# Patient Record
Sex: Female | Born: 1950 | Race: White | Hispanic: No | Marital: Single | Smoking: Former smoker
Health system: Southern US, Community
[De-identification: ages and names within clinical notes are randomized; demographics above are authoritative.]

## PROBLEM LIST (undated history)

## (undated) DIAGNOSIS — T4145XA Adverse effect of unspecified anesthetic, initial encounter: Secondary | ICD-10-CM

## (undated) DIAGNOSIS — E669 Obesity, unspecified: Secondary | ICD-10-CM

## (undated) DIAGNOSIS — I10 Essential (primary) hypertension: Secondary | ICD-10-CM

## (undated) DIAGNOSIS — F419 Anxiety disorder, unspecified: Secondary | ICD-10-CM

## (undated) DIAGNOSIS — C801 Malignant (primary) neoplasm, unspecified: Secondary | ICD-10-CM

## (undated) DIAGNOSIS — T8859XA Other complications of anesthesia, initial encounter: Secondary | ICD-10-CM

## (undated) DIAGNOSIS — J45998 Other asthma: Secondary | ICD-10-CM

## (undated) DIAGNOSIS — I82409 Acute embolism and thrombosis of unspecified deep veins of unspecified lower extremity: Secondary | ICD-10-CM

## (undated) DIAGNOSIS — F329 Major depressive disorder, single episode, unspecified: Secondary | ICD-10-CM

## (undated) DIAGNOSIS — N200 Calculus of kidney: Secondary | ICD-10-CM

## (undated) DIAGNOSIS — M1990 Unspecified osteoarthritis, unspecified site: Secondary | ICD-10-CM

## (undated) DIAGNOSIS — Z87442 Personal history of urinary calculi: Secondary | ICD-10-CM

## (undated) DIAGNOSIS — Z9289 Personal history of other medical treatment: Secondary | ICD-10-CM

## (undated) DIAGNOSIS — R112 Nausea with vomiting, unspecified: Secondary | ICD-10-CM

## (undated) DIAGNOSIS — K589 Irritable bowel syndrome without diarrhea: Secondary | ICD-10-CM

## (undated) DIAGNOSIS — F32A Depression, unspecified: Secondary | ICD-10-CM

## (undated) DIAGNOSIS — D649 Anemia, unspecified: Secondary | ICD-10-CM

## (undated) DIAGNOSIS — G43909 Migraine, unspecified, not intractable, without status migrainosus: Secondary | ICD-10-CM

## (undated) DIAGNOSIS — Z9889 Other specified postprocedural states: Secondary | ICD-10-CM

## (undated) DIAGNOSIS — J189 Pneumonia, unspecified organism: Secondary | ICD-10-CM

## (undated) HISTORY — PX: COLON SURGERY: SHX602

## (undated) HISTORY — DX: Irritable bowel syndrome, unspecified: K58.9

## (undated) HISTORY — PX: TONSILLECTOMY AND ADENOIDECTOMY: SUR1326

## (undated) HISTORY — PX: CHOLECYSTECTOMY: SHX55

## (undated) HISTORY — PX: UMBILICAL HERNIA REPAIR: SHX196

## (undated) HISTORY — PX: SHOULDER ARTHROSCOPY W/ ROTATOR CUFF REPAIR: SHX2400

## (undated) HISTORY — DX: Obesity, unspecified: E66.9

## (undated) HISTORY — PX: EYE SURGERY: SHX253

---

## 1989-02-13 HISTORY — PX: CARPAL TUNNEL RELEASE: SHX101

## 1991-06-16 DIAGNOSIS — I82409 Acute embolism and thrombosis of unspecified deep veins of unspecified lower extremity: Secondary | ICD-10-CM

## 1991-06-16 HISTORY — DX: Acute embolism and thrombosis of unspecified deep veins of unspecified lower extremity: I82.409

## 1997-11-29 ENCOUNTER — Ambulatory Visit (HOSPITAL_BASED_OUTPATIENT_CLINIC_OR_DEPARTMENT_OTHER): Admission: RE | Admit: 1997-11-29 | Discharge: 1997-11-29 | Payer: Self-pay | Admitting: Orthopedic Surgery

## 1998-01-09 ENCOUNTER — Emergency Department (HOSPITAL_COMMUNITY): Admission: EM | Admit: 1998-01-09 | Discharge: 1998-01-09 | Payer: Self-pay | Admitting: Emergency Medicine

## 1998-04-16 ENCOUNTER — Encounter: Payer: Self-pay | Admitting: Emergency Medicine

## 1998-04-16 ENCOUNTER — Inpatient Hospital Stay (HOSPITAL_COMMUNITY): Admission: EM | Admit: 1998-04-16 | Discharge: 1998-04-17 | Payer: Self-pay | Admitting: Emergency Medicine

## 1999-01-22 ENCOUNTER — Ambulatory Visit (HOSPITAL_COMMUNITY): Admission: RE | Admit: 1999-01-22 | Discharge: 1999-01-22 | Payer: Self-pay | Admitting: Family Medicine

## 1999-01-22 ENCOUNTER — Encounter: Payer: Self-pay | Admitting: Family Medicine

## 1999-01-31 ENCOUNTER — Encounter: Payer: Self-pay | Admitting: General Surgery

## 1999-02-03 ENCOUNTER — Other Ambulatory Visit: Admission: RE | Admit: 1999-02-03 | Discharge: 1999-02-03 | Payer: Self-pay | Admitting: *Deleted

## 1999-02-04 ENCOUNTER — Ambulatory Visit (HOSPITAL_COMMUNITY): Admission: RE | Admit: 1999-02-04 | Discharge: 1999-02-04 | Payer: Self-pay | Admitting: General Surgery

## 1999-03-25 ENCOUNTER — Other Ambulatory Visit: Admission: RE | Admit: 1999-03-25 | Discharge: 1999-03-25 | Payer: Self-pay | Admitting: Radiology

## 2000-02-05 ENCOUNTER — Other Ambulatory Visit: Admission: RE | Admit: 2000-02-05 | Discharge: 2000-02-05 | Payer: Self-pay | Admitting: *Deleted

## 2000-03-19 ENCOUNTER — Encounter (INDEPENDENT_AMBULATORY_CARE_PROVIDER_SITE_OTHER): Payer: Self-pay | Admitting: Specialist

## 2000-03-19 ENCOUNTER — Other Ambulatory Visit: Admission: RE | Admit: 2000-03-19 | Discharge: 2000-03-19 | Payer: Self-pay | Admitting: *Deleted

## 2000-06-15 DIAGNOSIS — Z9289 Personal history of other medical treatment: Secondary | ICD-10-CM

## 2000-06-15 HISTORY — PX: TOTAL KNEE ARTHROPLASTY: SHX125

## 2000-06-15 HISTORY — DX: Personal history of other medical treatment: Z92.89

## 2001-01-31 ENCOUNTER — Other Ambulatory Visit: Admission: RE | Admit: 2001-01-31 | Discharge: 2001-01-31 | Payer: Self-pay | Admitting: *Deleted

## 2001-06-15 HISTORY — PX: TOTAL KNEE ARTHROPLASTY: SHX125

## 2001-10-26 ENCOUNTER — Encounter: Payer: Self-pay | Admitting: Orthopedic Surgery

## 2001-10-31 ENCOUNTER — Encounter: Payer: Self-pay | Admitting: Orthopedic Surgery

## 2001-10-31 ENCOUNTER — Inpatient Hospital Stay (HOSPITAL_COMMUNITY): Admission: RE | Admit: 2001-10-31 | Discharge: 2001-11-08 | Payer: Self-pay | Admitting: Orthopedic Surgery

## 2001-12-23 ENCOUNTER — Encounter: Payer: Self-pay | Admitting: General Surgery

## 2001-12-23 ENCOUNTER — Inpatient Hospital Stay (HOSPITAL_COMMUNITY): Admission: RE | Admit: 2001-12-23 | Discharge: 2001-12-30 | Payer: Self-pay | Admitting: Orthopedic Surgery

## 2001-12-26 ENCOUNTER — Encounter: Payer: Self-pay | Admitting: Orthopedic Surgery

## 2002-02-16 ENCOUNTER — Other Ambulatory Visit: Admission: RE | Admit: 2002-02-16 | Discharge: 2002-02-16 | Payer: Self-pay | Admitting: *Deleted

## 2003-03-08 ENCOUNTER — Other Ambulatory Visit: Admission: RE | Admit: 2003-03-08 | Discharge: 2003-03-08 | Payer: Self-pay | Admitting: *Deleted

## 2003-12-13 ENCOUNTER — Ambulatory Visit: Admission: RE | Admit: 2003-12-13 | Discharge: 2003-12-13 | Payer: Self-pay | Admitting: Family Medicine

## 2004-03-05 ENCOUNTER — Encounter: Admission: RE | Admit: 2004-03-05 | Discharge: 2004-06-03 | Payer: Self-pay | Admitting: Surgery

## 2004-03-10 ENCOUNTER — Other Ambulatory Visit: Admission: RE | Admit: 2004-03-10 | Discharge: 2004-03-10 | Payer: Self-pay | Admitting: *Deleted

## 2004-03-10 ENCOUNTER — Ambulatory Visit (HOSPITAL_BASED_OUTPATIENT_CLINIC_OR_DEPARTMENT_OTHER): Admission: RE | Admit: 2004-03-10 | Discharge: 2004-03-10 | Payer: Self-pay | Admitting: Surgery

## 2004-03-11 ENCOUNTER — Ambulatory Visit (HOSPITAL_COMMUNITY): Admission: RE | Admit: 2004-03-11 | Discharge: 2004-03-11 | Payer: Self-pay | Admitting: Surgery

## 2004-03-15 HISTORY — PX: ROUX-EN-Y GASTRIC BYPASS: SHX1104

## 2004-04-08 ENCOUNTER — Inpatient Hospital Stay (HOSPITAL_COMMUNITY): Admission: RE | Admit: 2004-04-08 | Discharge: 2004-04-11 | Payer: Self-pay | Admitting: Surgery

## 2004-04-19 ENCOUNTER — Inpatient Hospital Stay (HOSPITAL_COMMUNITY): Admission: EM | Admit: 2004-04-19 | Discharge: 2004-04-24 | Payer: Self-pay | Admitting: Emergency Medicine

## 2004-07-01 ENCOUNTER — Encounter: Admission: RE | Admit: 2004-07-01 | Discharge: 2004-09-29 | Payer: Self-pay | Admitting: Surgery

## 2004-07-30 ENCOUNTER — Observation Stay (HOSPITAL_COMMUNITY): Admission: EM | Admit: 2004-07-30 | Discharge: 2004-07-31 | Payer: Self-pay | Admitting: Emergency Medicine

## 2004-10-28 ENCOUNTER — Encounter: Admission: RE | Admit: 2004-10-28 | Discharge: 2005-01-26 | Payer: Self-pay | Admitting: Surgery

## 2005-02-04 ENCOUNTER — Encounter: Admission: RE | Admit: 2005-02-04 | Discharge: 2005-05-05 | Payer: Self-pay | Admitting: Surgery

## 2005-03-09 ENCOUNTER — Other Ambulatory Visit: Admission: RE | Admit: 2005-03-09 | Discharge: 2005-03-09 | Payer: Self-pay | Admitting: *Deleted

## 2005-04-03 ENCOUNTER — Encounter: Admission: RE | Admit: 2005-04-03 | Discharge: 2005-04-03 | Payer: Self-pay | Admitting: Surgery

## 2005-06-15 HISTORY — PX: SHOULDER ARTHROSCOPY W/ ROTATOR CUFF REPAIR: SHX2400

## 2005-07-07 ENCOUNTER — Encounter: Admission: RE | Admit: 2005-07-07 | Discharge: 2005-10-05 | Payer: Self-pay | Admitting: Surgery

## 2005-12-09 IMAGING — RF DG UGI W/ GASTROGRAFIN
15 of 17 series · 15 of 17 positions shown · non-contrast
Comparison: None.

CLINICAL DATA: Morbid obesity. Gastric bypass surgery yesterday.

GASTROGRAFIN UPPER GI SERIES  04/09/2004

[Series 1: run · 1 of 1 slices shown (1 of 13)]
[im 1/1]
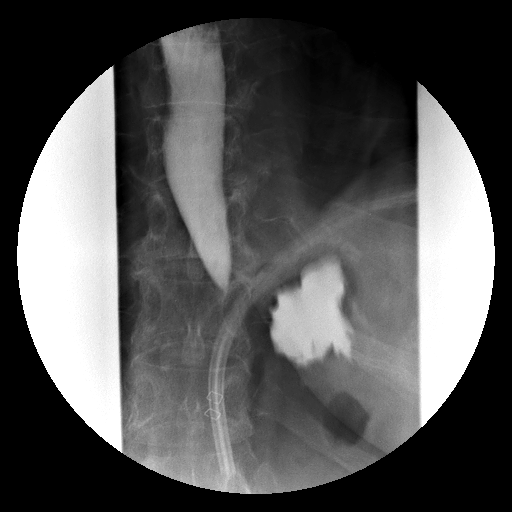

[Series 2: run · 1 of 1 slices shown (2 of 13)]
[im 1/1]
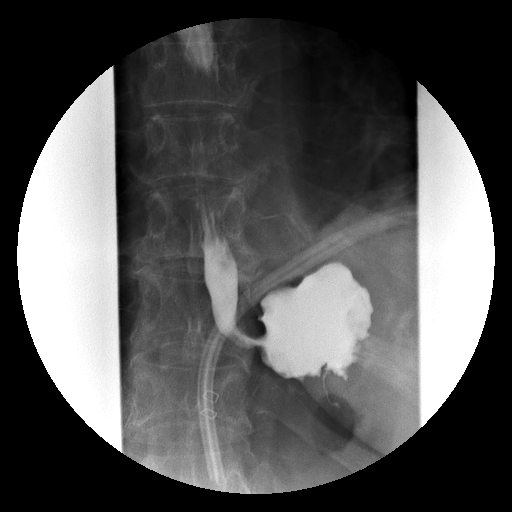

[Series 3: run · 1 of 1 slices shown (3 of 13)]
[im 1/1]
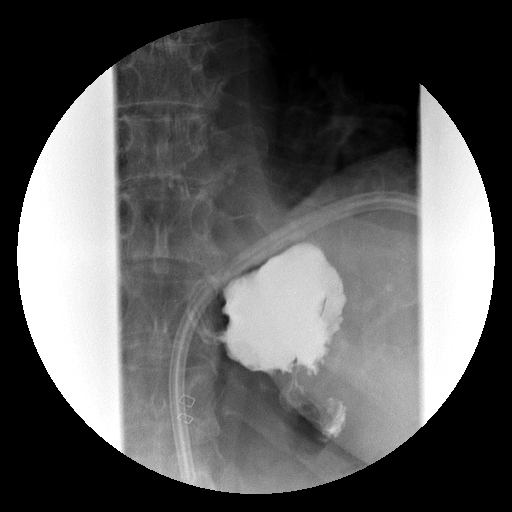

[Series 4: run · 1 of 1 slices shown (4 of 13)]
[im 1/1]
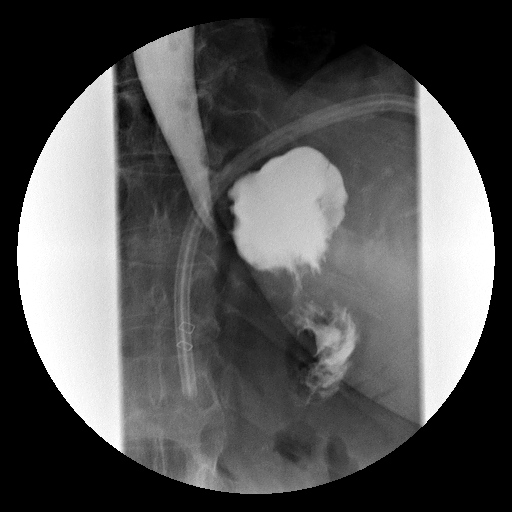

[Series 5: run · 1 of 1 slices shown (5 of 13)]
[im 1/1]
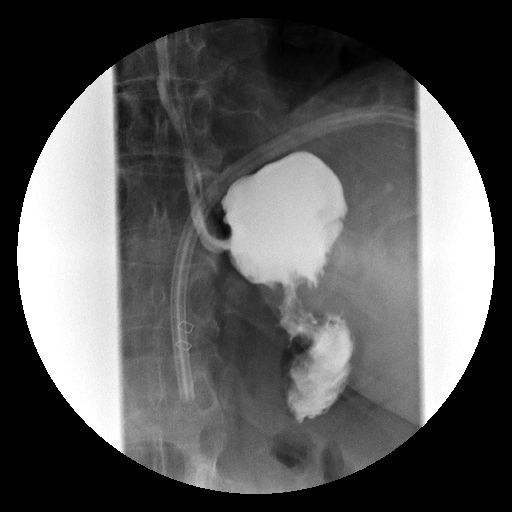

[Series 6: run · 1 of 1 slices shown (6 of 13)]
[im 1/1]
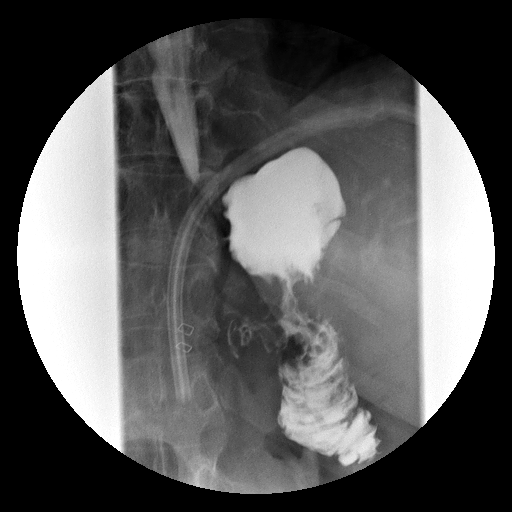

[Series 7: run · 1 of 1 slices shown (7 of 13)]
[im 1/1]
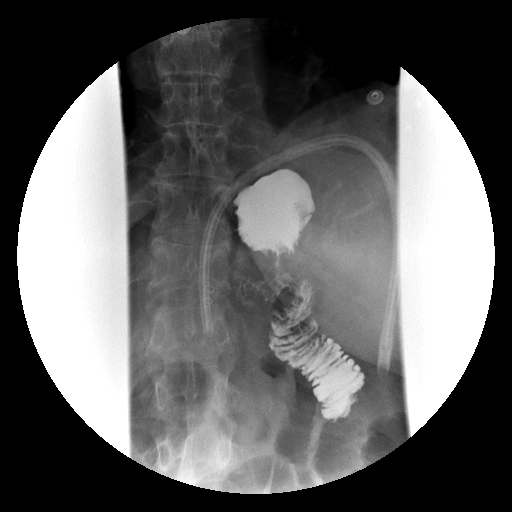

[Series 8: run · 1 of 1 slices shown (8 of 13)]
[im 1/1]
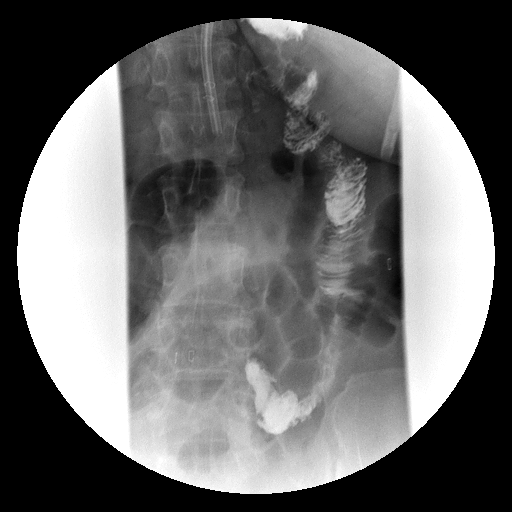

[Series 9: run · 1 of 1 slices shown (9 of 13)]
[im 1/1]
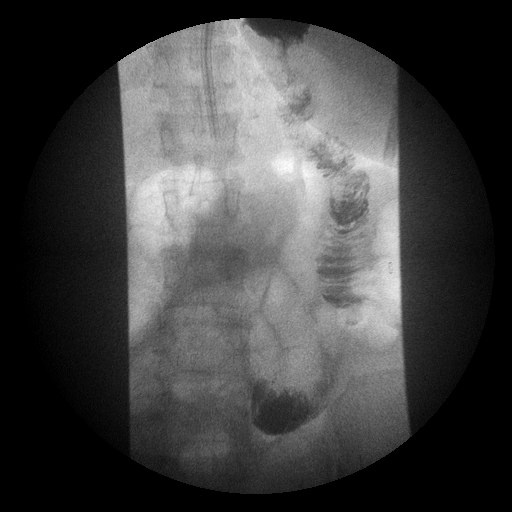

[Series 10: run · 1 of 1 slices shown (10 of 13)]
[im 1/1]
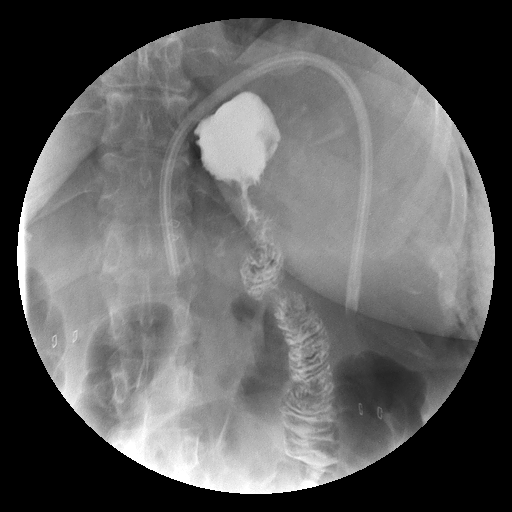

[Series 11: run · 1 of 1 slices shown (11 of 13)]
[im 1/1]
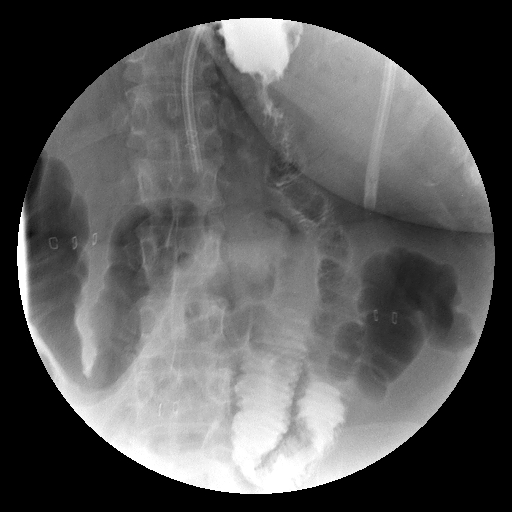

[Series 12: run · 1 of 1 slices shown (12 of 13)]
[im 1/1]
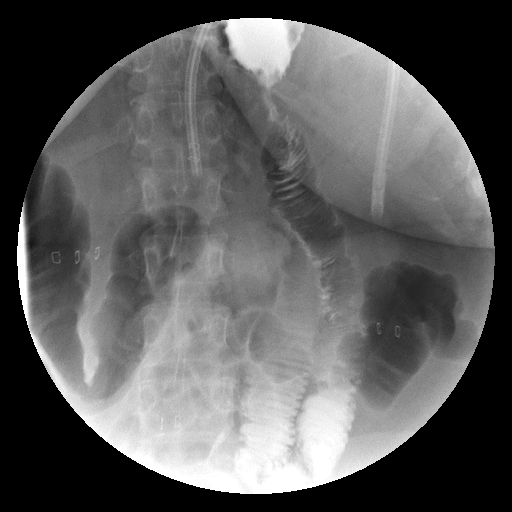

[Series 13: run · 1 of 1 slices shown (13 of 13)]
[im 1/1]
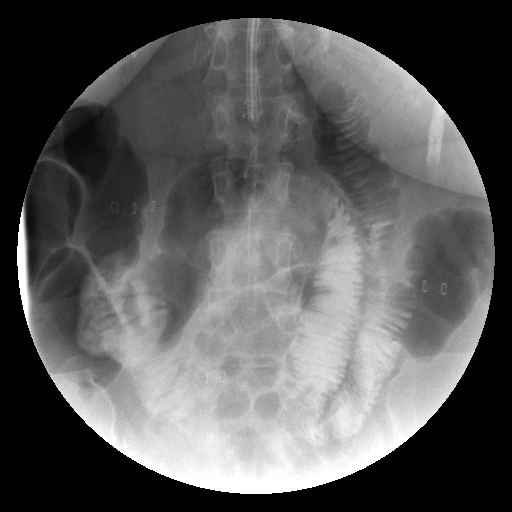

[Series 1001: view not recorded · 0.20mm/px · 1 of 1 slices shown (1 of 2)]
[im 1/1]
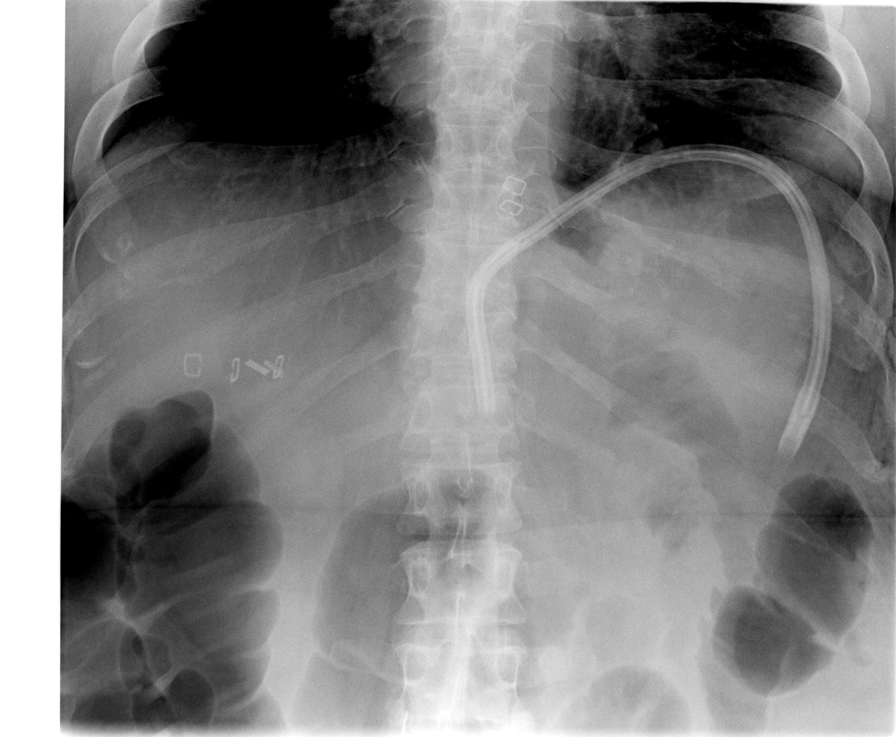

[Series 1002: view not recorded · 0.20mm/px · 1 of 1 slices shown (2 of 2)]
[im 1/1]
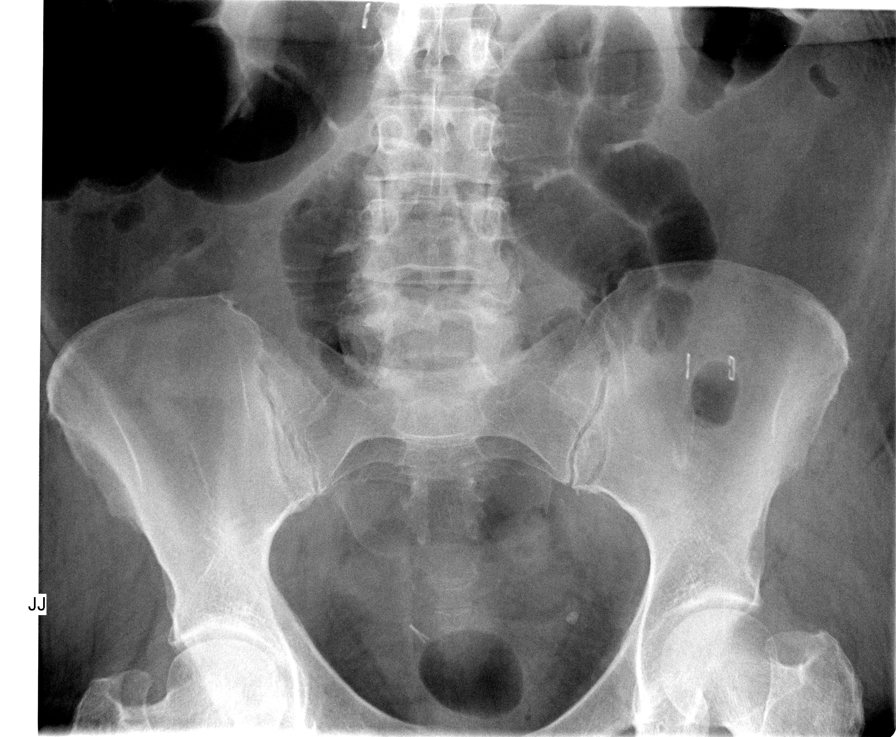

[15 of 17 positions shown; findings below may reference images not displayed]

FINDINGS: The initial scout AP supine abdominal film demonstrates mild gaseous distention of the
colon and small bowel. A phlebolith is noted in the left of the pelvis.

The patient swallowed a total of 50 cc of Gastrografin without difficulty. The gastric pouch has a
normal appearance. There is no significant esophageal dysmotility, and the esophagus emptied
promptly and normally. The gastric pouch also empties promptly. There is no evidence of leak at the
proximal  anastomosis. There is a mild ileus, with mild dilation of the efferent limb. However,
contrast ultimately reaches the level of the jejunojejunal anastomosis. No extravasation is seen
here.

IMPRESSION

1. Satisfactory appearance status post gastric bypass.

2. The gastric pouch empties promptly. There is no leak.

3. Mild ileus. 

These results were telephoned to Dr. Freesia and Dr. Kazimiera at time of the performance of the
examination on the morning of 04/09/2004.

## 2006-04-29 ENCOUNTER — Other Ambulatory Visit: Admission: RE | Admit: 2006-04-29 | Discharge: 2006-04-29 | Payer: Self-pay | Admitting: *Deleted

## 2007-03-17 ENCOUNTER — Other Ambulatory Visit: Admission: RE | Admit: 2007-03-17 | Discharge: 2007-03-17 | Payer: Self-pay | Admitting: *Deleted

## 2010-06-15 DIAGNOSIS — J189 Pneumonia, unspecified organism: Secondary | ICD-10-CM

## 2010-06-15 HISTORY — DX: Pneumonia, unspecified organism: J18.9

## 2010-07-17 ENCOUNTER — Other Ambulatory Visit (HOSPITAL_COMMUNITY)
Admission: RE | Admit: 2010-07-17 | Discharge: 2010-07-17 | Disposition: A | Discharge status: Home / Self Care | Payer: BC Managed Care – PPO | Source: Ambulatory Visit | Attending: Family Medicine | Admitting: Family Medicine

## 2010-07-17 ENCOUNTER — Other Ambulatory Visit: Payer: Self-pay | Admitting: Physician Assistant

## 2010-07-17 DIAGNOSIS — Z0000 Encounter for general adult medical examination without abnormal findings: Secondary | ICD-10-CM | POA: Insufficient documentation

## 2010-10-31 NOTE — Op Note (Signed)
NAME:  Nichole Frank, Nichole Frank             ACCOUNT NO.:  [id number]   MEDICAL RECORD NO.:  [id number]          PATIENT TYPE:  INP   LOCATION:  0002                         FACILITY:  [facility]   PHYSICIAN:  Thornton Park. Daphine Deutscher, MD  DATE OF BIRTH:  Mar 30, 1951   DATE OF PROCEDURE:  04/08/2004  DATE OF DISCHARGE:                                 OPERATIVE REPORT   PREOPERATIVE DIAGNOSES:  Morbid obesity with orthopedic comorbidities and a  basic metabolic index of 47.   PROCEDURE:  Laparoscopy Roux-e-Y gastric bypass (100 cm Roux limb with a 40  cm biopancreatic limb), closed.   SURGEON:  Thornton Park. Daphine Deutscher, M.D.   ASSISTANT:  Vikki Ports, M.D.   ENDOSCOPIST:  Sandria Bales. Ezzard Standing, M.D.   OPERATIVE TIME:  Three hours.   DESCRIPTION OF PROCEDURE:  The patient was brought to the operating room #1  and given general anesthesia.  The abdomen was prepped with Betadine and  draped sterilely.  I made a small incision in the left upper quadrant, and  using a 0-degree 10 mm scope, and a 12 mm Opti-Vu trocar, I inserted this  into the abdomen without difficulty.  This was using the Ethicon XL trocar.  The abdomen was insufflated.  The patient had adhesions from a previous mesh  repair around the umbilicus.  After placing two upper ports to the right of  the midline, both 12's, I used a harmonic scalpel to take this omentum that  was fixed into this, and divided these.  The scope port was a 10/11 to the left and below the umbilicus, and then a  second 5 mm was placed lateral on the left side for Dr. Luan Pulling to use as  a 5 mm access point.  First, we elevated a very big bulky omental drape, and found the ligament of  Treitz.  I counted 40 cm distal to this and divided the bowel with the endo  GIA.  I then marked the distal end and holding the biopancreatic limb, then  counted 100 cm downstream.  I marked it with a Penrose drain which was  sutured along the staple line.  I had reached the 100 cm  mark.  The bowel  ends were aligned on the antimesenteric borders.  A jejunojejunostomy was  created using the Ethicon stapler.  The common defect was closed with #2-0  Vicryl from either end and tied in the middle.  This was a nice tight  anastomosis, that was eventually reinforced also with Tisseel.  The  mesenteric defect was closed with a running silk.  Next, another 5 mm trocar was placed in the upper midline, through which the  [facility] retractor was inserted and the liver was elevated.  I went up at  the cardiac, at the esophagogastric junction, and came down and opened a  space to dissect to.  I counted 4 cm down the lesser curvature with the  harmonic scalpel, and began creating a retrogastric window.  This dissection  proceeded nicely, and I was able to insert the 6 cm Ethicon stapler and fire  this across.  The second  application with the 6 cm stapler brought me into  the lesser sac nicely.  I then re-identified my spot up top, and with  another 6 cm and another 4.5cm, we completely divided the stomach.  The  gastric remnant staple line was inspected and looked viable and good and  healthy, and Tisseel was applied to this.  Tisseel was applied also to the proximal gastric pouch, and this too looked  good and healthy.  The pouch was created with the aid of a UL2 which was  passed and re-passed to prevent closure of the esophagogastric junction.  Next, the Roux limb was brought up with a candy cane facing the right.  It  was a little bit tight initially, and I opened up the mesentery a little bit  more between the jejunocecostomy and the Roux limb, and I also divided the  omentum in its entirety.  This enabled the Roux limb to come up easily.  The  back wall was placed with running #2-0 Vicryl.  At that point I inspected  the jejunocecostomy and looked downstream, and it seemed that it rotated a  bit, but all limbs appeared patent, and I elected to go ahead and put a  stitch  distally on the outflow track, to keep alignment with the  biopancreatic limb.  A gastrojejunostomy was then created by opening on either side of the back  wall and inserting the 4.5 cm stapler.  This was fired once, and a nice  anastomosis was present.  The common defect was closed from either end with  #2-0 Vicryl.  Actually I ended up cutting this extra little lip of tissue  along the gastrojejunostomy and closed this defect from either end, slightly  off from the anterior staple line, but tucking this all in.  These were tied  in the midline and was hemostatic.  A second row of #2-0 Vicryl placed using the laparotome on either end  placed, using a free suture, and this imbricated the stomach and jejunum  over the suture line closure.  Dr. Sandria Bales. Newman had scrubbed in for the last closure of the  jejunocecostomy, and he went ahead and scrubbed out and put the Passy  endoscope without difficulty and documented the size of the pouch.  We saw  no evidence of leaking, bubbles, or bleeding.  He withdrew the scope and  decompressed the bowel.  I had clamped the bowel to prevent insufflation of  the entire bowel.  He removed the scope which he had passed without  difficulty.  Tisseel was applied to the gastric jejunostomy, and then a  drain was placed in the sub-hepatic space and brought out through the trocar  site on the left side.  This was sutured with a #3-0 nylon to the skin.  The  wounds were all closed with #4-0 Vicryl, after decompressing the abdomen, as  well as staples.  The patient seemed to tolerate the procedure well.  She was taken to the  recovery room in satisfactory condition.     Matt   MBM/MEDQ  D:  04/08/2004  T:  04/08/2004  Job:  [id number]   cc:   Dario Guardian, M.D.  [address]  [place]  [place] [address]  Fax: [phone]

## 2010-10-31 NOTE — Consult Note (Signed)
NAME:  Nichole Frank, Nichole Frank NO.:  [id number]   MEDICAL RECORD NO.:  [id number]          PATIENT TYPE:  OBV   LOCATION:  0105                         FACILITY:  [facility]   PHYSICIAN:  Rozanna Boer., M.D.DATE OF BIRTH:  [birth year]   DATE OF CONSULTATION:  07/30/2004  DATE OF DISCHARGE:                                   CONSULTATION   REFERRING PHYSICIAN:  Thornton Park. Daphine Deutscher, M.D.   REASON FOR CONSULTATION:  Left distal ureteral stone.   HISTORY OF PRESENT ILLNESS:  This 60 year old, single, white female was  admitted through the emergency room with acute onset of left lower quadrant  pain with nausea and vomiting several hours ago.  She has never had a  previous stone, but CT scan showed a mild to moderate left hydronephrosis  with a distal left 3 mm stone.  She has not had previous stone.  She has no  family history of stones, but did have nausea and vomiting.  She has not had  any fever.  Her last urinary tract infection was over a year ago.   MEDICATIONS:  Her medicines include lisinopril, Wellbutrin and Lasix.   ALLERGIES:  She has no known allergies.   PAST SURGICAL HISTORY:  Previous operations include:  1.  A gastric bariatric procedure on April 08, 2004.  2.  Gallbladder around 1996.  3.  Bilateral total knees in May of 2002.   PAST MEDICAL HISTORY:  No bowel problems.  No heart or lung problems.  Nonsmoker.  No asthma.  No arthritis or joint disease.   SOCIAL HISTORY:  She is single.  She works at [employer].  Does not  drink much alcohol and does not smoke.   FAMILY HISTORY:  She has a 71 year old sister.  Both parents deceased, her  father at age 32 of heart failure and mother of lymphoma at 51.  There is no  family history of stone disease, diabetes or cancer that she knows of.  She  without refills   PHYSICAL EXAMINATION:  VITAL SIGNS:  Her blood pressure is 161/86, pulse  103, temperature 99.7 degrees and respirations 18.  GENERAL  APPEARANCE:  She is a heavyset, obese, white female complaining of  left lower quadrant pain and nausea.  HEENT:  Clear.  NECK:  Supple.  LUNGS:  Clear.  ABDOMEN:  Soft, but does have some mild left CVA tenderness.  She is quite  obese.  There are some bruises over her abdomen and scars from previous  laparoscopic procedure.  PELVIC:  Deferred at this time.  EXTREMITIES:  2+ edema.  Intact sensation light.  Palpable pulses  bilaterally.  SKIN:  Warm and dry.   IMPRESSION:  1.  A 3 mm left distal ureteral stone with obstruction.  2.  Recent bariatric gastric bypass procedure in October of 2005.  3.  Nausea and vomiting.   RECOMMENDATIONS:  1.  IV fluids.  2.  Hydrate.  3.  Treat pain as needed.  4.  Heating pad.  5.  Flomax 0.4 mg might help the stone to pass as well.  6.  Will strain her urine and if it does not pass in a reasonable length of      time, then ureteroscopy could be undertaken, but I would like to see her      pass this first.      HMK/MEDQ  D:  07/30/2004  T:  07/30/2004  Job:  [id number]

## 2010-10-31 NOTE — Discharge Summary (Signed)
NAME:  Nichole Frank, Nichole Frank                       ACCOUNT NO.:  [id number]   MEDICAL RECORD NO.:  [id number]                   PATIENT TYPE:  INP   LOCATION:  5030                                 FACILITY:  [facility]   PHYSICIAN:  Oris Drone. Petrarca, P.A.-C.          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   DISCHARGE DIAGNOSES:  1. 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  2. Postoperative anemia.  3. Status post right total knee replacement.  4. Hypertension.   PROCEDURE:  Left total knee replacement.   HISTORY:  A 60 year old white single female status post right total knee  replacement.  She has had progressive degenerative changes in her left knee.  She has failed with conservative treatment on the left.  She is now  indicated for a left total knee replacement.   HOSPITAL COURSE:  A 60 year old female admitted December 23, 2001 and after  appropriate laboratory studies were obtained, as well as 1 g of Ancef IV, on-  call to the operating room, was taken to the operating room where she  underwent a left total knee replacement.  She tolerated the procedure well.  She is continued on Ancef  1 g IV q.8h. x3 doses.  A Dilaudid PCA pump was ordered for pain management.  Heparin 5000 units subcu q.12h. until her Coumadin became therapeutic.  Consultation with PT, OR, and rehab were made.  CPM 0-30 degrees for 8-10  hours per day.  She may be weightbearing as tolerated in physical therapy.  She did have some difficulty with postoperative pain management.  PCA was  discontinued and she was placed on OxyContin 40 mg p.o. b.i.d. with Percocet  5/325 one to two q.4h. p.r.n. breakthrough pain.  Transfused 2 units of  packed cells on July 13.  For her hypokalemia, K-Dur 40 mEq p.o. was given.  On July 14,  she had her Percocet discontinued, as well as the OxyContin.  Placed on Dilaudid 2-4 mg p.o. q.4h. p.r.n. pain.  Benadryl 25 mg p.o. q.8h.  p.r.n. was also ordered.  X-ray of the left knee was performed on July 14  and was within normal limits.  On July 15, she did have a Doppler of her  left leg to rule out a DVT.  OxyContin was increased from 30 to 40 mg q.12h.  Percocet for breakthrough pain.  On July 16, she was allowed to shower.  The  remainder of her hospital course was uneventful and she was discharged on  July 18 to return back to the office in followup.  Doppler July 15 was read  as no evidence of DVT, SVT.  Unable to evaluate for Baker's cyst due to body  habitus.   RADIOGRAPHIC STUDIES:  Left knee of  July 11 revealed anatomic alignment  status post left total knee replacement.  On July 14, x-ray of the left knee  showed satisfactory left total knee replacement.   LABORATORY STUDIES:  Preoperative hemoglobin was 12.5, hematocrit 37.7%,  white count 6900, platelets 294,000.  Discharge hemoglobin 9.4, hematocrit  28.4%, white count 6200, platelets 244,000.  Chemistries preoperatively:  Sodium 136, potassium 4.3, chloride 101, CO2 30, glucose 91, BUN 16,  creatinine 0.9, calcium 9.0, total protein 6.3, albumin 3.2, AST 17, ALT 16,  ALP 67, total bilirubin 0.4.  Discharge sodium 141, potassium 3.9, chloride  103, CO2 32, glucose 103, BUN 5, creatinine 0.6, calcium 8.4.  Urinalysis  was benign for voided urine.  Blood type O+, antibody screen negative.  Two  units of packed cells were given during her hospital course.   DISCHARGE INSTRUCTIONS:  She was given a prescription for OxyContin 20 mg  one tablet q.12h., Percocet 5/325 one to two tablets q.3-4h. p.r.n.  breakthrough pain, iron sulfate 324 mg daily, Coumadin as directed by  [facility], Robaxin 500 mg one tablet q.8h. p.r.n. spasm.   ACTIVITIES:  As taught in physical therapy.   DIET:  No restrictions on her  diet.   WOUND CARE:  She will keep her wounds clean and dry.  Cover with a bandage  as taught.  Call if she has any problems in the interim.   FOLLOW UP:  Follow back up with [place] in 7-10 days for a recheck evaluation.                                                Oris Drone Petrarca, P.A.-C.    BDP/MEDQ  D:  01/07/2002  T:  01/15/2002  Job:  [id number]

## 2010-10-31 NOTE — H&P (Signed)
NAME:  Nichole Frank, Nichole Frank             ACCOUNT NO.:  [id number]   MEDICAL RECORD NO.:  [id number]          PATIENT TYPE:  INP   LOCATION:  0103                         FACILITY:  [facility]   PHYSICIAN:  Currie Paris, M.D.DATE OF BIRTH:  02-22-1951   DATE OF ADMISSION:  04/19/2004  DATE OF DISCHARGE:                                HISTORY & PHYSICAL   CHIEF COMPLAINT:  Abdominal pain.   HISTORY OF PRESENT ILLNESS:  Nichole Frank is a 60 year old lady who is  approximately 9 days status post Roux-Y bariatric gastric bypass.  Yesterday  afternoon about 5 o'clock she developed some left upper quadrant pain, which  has been persistent.  It waxes and wanes a little bit, but is fairly steady,  mainly in the left upper quadrant.  She has no pain in the right upper  quadrant or lower abdomen.  She has had no nausea, vomiting, fevers, or  chills.  Because of the persistent pain, she apparently spoke with Dr.  Magnus Ivan early this morning, and was told if the pain medicine was not  working to go ahead and come to the emergency room, which she did this  afternoon.   At this point, she still has not had any nausea or vomiting.  She continues  to cough and have pain on the left side of the abdomen.  It is not made  better or worse by anything she does in particularly, although changing  position does seem to hurt a little bit.  She has not had any urinary  symptoms.  No burning or stinging.  She has had no other illnesses.   PAST MEDICAL HISTORY:  There have been no changes since her recent  admission.  She does have a history of hypertension, and has not been on  medicines.  She has taken Wellbutrin in the past but has not started that  since went home from her surgery.  She has had bilateral knee replacements,  apparently necessitated by wear and tear on her knees from her obesity.   PHYSICAL EXAMINATION:  GENERAL:  The patient is alert, awake, comfortable,  and in no distress sitting in a  chair.  HEENT:  Head is normocephalic.  Eyes are nonicteric pharynx is normal.  Mucous membranes are perhaps slightly dry.  NECK:  Supple.  No masses or thyromegaly.  LUNGS:  Normal respirations, and clear to auscultation.  HEART:  Regular rhythm.  No murmurs, rubs, or gallops are heard.  ABDOMEN:  Obese.  Well-healed incisions from her bariatric surgery.  She is  tender across the left upper abdomen with some guarding and perhaps some  rebound.  The rest of the abdomen seems fairly soft and not particularly  tender.  Bowel sounds are present.  EXTREMITIES:  No cyanosis or edema.   LABORATORY DATA:  Of particular note, her white count is about 18,000 with a  hemoglobin of 13 (hemoglobin was 12, white count 9900 on April 10, 2004).  Serum electrolytes looked fairly normal.  KUB and upright chest x-ray showed  no evidence of free air.  There were some dilated loops of small  bowel in  the abdomen.  Question early small bowel obstruction.   IMPRESSION:  Early postoperative abdominal pain, status post recent  bariatric bypass.   PLAN:  I am going to get her admitted on some IV fluids, as I think she is a  little volume depleted.  In addition, a CT scan has been ordered.     Chri   CJS/MEDQ  D:  04/19/2004  T:  04/19/2004  Job:  [id number]

## 2010-10-31 NOTE — Procedures (Signed)
[place]  Patient:    [name], [name] Visit Number: [id number] MRN: [id number]          Service Type: SUR Location: 5000 5028 01 Attending Physician:  Colbert Ewing Dictated by:   Guadalupe Maple, M.D. Proc. Date: 10/31/01 Admit Date:  10/31/2001                             Procedure Report  PROCEDURE:  Lumbar epidural catheter placement for postoperative pain control.  ANESTHESIOLOGIST:  Guadalupe Maple, M.D.  INDICATIONS:  Nichole Frank is a 60 year old white female with a history of degenerative disk disease involving the right knee.  She underwent a right total knee replacement by Dr. Loreta Ave under general endotracheal anesthesia.  Prior to the procedure, the risks and benefits of epidural pain control were discussed with the patient and she understood these risks and agreed to proceed with this form of pain control following surgery. Dr. Eulah Pont requested that I insert the epidural catheter.  DESCRIPTION OF PROCEDURE:  Upon completion of the procedure, while the patient was still under general endotracheal anesthesia, she was turned to the right lateral decubitus position.  The low back was prepped and draped sterilely. The L3-4 interspace was entered in the midline using an 18-gauge Tuohy needle. One pass of the needle was required to enter the epidural space in the midline using a loss-of-resistance technique with air.  Aspiration of the needle was negative for blood or CSF.  Ten cc of preservative-free saline to which were added 100 mcg of fentanyl and 50 mg of 1% lidocaine were injected through the needle without difficulty.  The catheter was then threaded 4 cm into the epidural space and the needle was removed without difficulty.  The catheter was then taped securely in place.  The patient was subsequently turned to the supine position, extubated and brought to the recovery room in stable condition.  She will  then be begun on an epidural fentanyl and Marcaine infusion and followed on a daily basis by the anesthesia service.  She tolerated the procedure well without apparent complications.Dictated by: Guadalupe Maple, M.D. Attending Physician:  Colbert Ewing DD:  10/31/01 TD:  11/01/01 Job: [id number]

## 2010-10-31 NOTE — Discharge Summary (Signed)
[name], Nichole Frank             ACCOUNT NO.:  [id number]   MEDICAL RECORD NO.:  [id number]          PATIENT TYPE:  INP   LOCATION:  0464                         FACILITY:  [facility]   PHYSICIAN:  Vikki Ports, MDDATE OF BIRTH:  1951/01/27   DATE OF ADMISSION:  04/08/2004  DATE OF DISCHARGE:  04/11/2004                                 DISCHARGE SUMMARY   ADMISSION DIAGNOSES:  Morbid obesity with orthopedic comorbidities.   DISCHARGE DIAGNOSES:  Morbid obesity with orthopedic comorbidities.   PROCEDURE:  Laparoscopic roux-en-Y gastric bypass.   HOSPITAL COURSE:  The patient was admitted after home bowel prep and  underwent laparoscopic roux-en-Y gastric bypass. She was watched in the step-  down unit secondary to her obstructed sleep apnea. The Foley was removed.  Upper GI showed no leak and a patent anastomosis. She had a negative  ultrasound for DVT, she was started on a clear liquid diet. No beds were  available and the patient remained in the ICU until postoperative day where  she was transferred to [facility]. She was then started on a high protein  liquid diet which she tolerated slowly but was doing well and by  postoperative day #3 was ready for discharge home.   DISPOSITION:  Discharged to home.   CONDITION ON DISCHARGE:  Good and improved.   FOLLOW UP:  With Dr. Daphine Deutscher in one week.     [name]   KRH/MEDQ  D:  04/11/2004  T:  04/12/2004  Job:  [id number]

## 2010-10-31 NOTE — Procedures (Signed)
NAME:  Nichole Frank, Nichole Frank             ACCOUNT NO.:  [id number]   MEDICAL RECORD NO.:  [id number]          PATIENT TYPE:  OUT   LOCATION:  SLEEP CENTER                 FACILITY:  [facility]   PHYSICIAN:  Clinton D. Maple Hudson, M.D. DATE OF BIRTH:  26-Feb-1951   DATE OF STUDY:  DATE OF DISCHARGE:  03/10/2004                              NOCTURNAL POLYSOMNOGRAM   REFERRING PHYSICIAN:  Dr. Luretha Murphy   INDICATION FOR STUDY:  Hypersomnia with sleep apnea.   EPWORTH SLEEPINESS SCORE:  2/24   BMI:  49.   WEIGHT:  318 pounds.   MEDICATION LIST:  Lasix, Klor-Con, Lisinopril, phentermine, Wellbutrin.   SLEEP ARCHITECTURE:  Total sleep time 325 minutes with sleep efficiency 81%.  Stage I was 17%, Stage II 77%, Stages III and IV were absent, REM was 6% of  total sleep time.  Sleep latency 12 minutes, REM latency 291 minutes, awake  after sleep onset 64 minutes, arousal index 84, which is markedly increased.  It appears related to leg jerks, as well as respiratory events.   RESPIRATORY DATA:  Split-study protocol.  RDI 42.7/hr, indicating moderately  severe obstructive sleep apnea/hypopnea syndrome before CPAP control. This  reflected to obstructive apneas at 120 hypopneas.  The events were not  positional.  REM RDI 3/hr.  CPAP was titrated to 9 CWP, RDI 0/hr.  A large  Resmed full face mask was used with a heated humidifier and tolerated well.   OXYGEN DATA:  Moderate to loud snoring with mild oxygen desaturation to a  nadir of 87%  before CPAP.  After CPAP control, mean saturation held 94-956%  on room air.   CARDIAC DATA:  Normal sinus rhythm with rare PVC.   MOVEMENT/PARASOMNIA:  269 limb jerks were recorded of which 114 were  associated with arousal or awakening for a periodic limb movement with  arousal index of 21/hr which is significantly abnormal.   IMPRESSION/RECOMMENDATION:  Moderately severe obstructive sleep  apnea/hypopnea syndrome, respiratory disturbance index 42.7/hr with  mild  oxygen desaturation to 87%.  Successful CPAP titration to 9 CWP, RDI 0/hr,  using a large Resmed full face mask with heated humidifier.  An additional  diagnosis of periodic limb movement  syndrome with arousal, 21 times per hour, is noteworthy, and may respond to  a trial of clonazepam of Requip if appropriate.      CDY/MEDQ  D:  03/16/2004 11:55:46  T:  03/17/2004 66:44:03  Job:  [id number]

## 2010-10-31 NOTE — Op Note (Signed)
[name]  Patient:    Nichole Frank, Nichole Frank Visit Number: [id number] MRN: [id number]          Service Type: SUR Location: 5000 5028 01 Attending Physician:  Colbert Ewing Dictated by:   Loreta Ave, M.D. Proc. Date: 10/31/01 Admit Date:  10/31/2001                             Operative Report  PREOPERATIVE DIAGNOSIS:  End-stage degenerative arthritis of right knee with varus alignment.  POSTOPERATIVE DIAGNOSIS:  End-stage degenerative arthritis of right knee with varus alignment.  OPERATIVE PROCEDURE:  Right total knee replacement utilizing Osteonics prosthesis, Press-Fit #9 posterior stabilizing femoral component, cemented #9 tibial component with 12 mm polyethylene insert, cemented recessed nonmetal back 28 mm patellar component, appropriate soft tissue balancing including lateral release.  SURGEON:  Loreta Ave, M.D.  ASSISTANT:  Arlys John D. Petrarca, P.A.-C.  ANESTHESIA:  General.  ESTIMATED BLOOD LOSS:  Minimal.  TOURNIQUET TIME:  One hour and 20 minutes.  SPECIMENS:  Excised bone and soft tissue.  CULTURES:  None.  COMPLICATIONS:  None.  DRESSING:  Self-compressive.  DRAINS:  Hemovac x2.  DESCRIPTION OF PROCEDURE:  The patient was brought to the operating room and placed on the operating table in the supine position.  After adequate anesthesia had been obtained, the right knee examined.  Mild flexion contracture with further flexion to 90 degrees.  Alignment varus and stable ligaments.  Correctable to neutral.  Lateral patellofemoral tracking and tethering.  Tourniquet applied, prepped and draped in the usual sterile fashion.  Exsanguinated with elevation and Esmarch.  Tourniquet inflated to 375 mmHg.  A straight incision above the patella down to the tibial tubercle. Skin and subcutaneous tissue divided and hemostasis obtained with cautery. Medial parapatellar arthrotomy.  Knee exposed.  Grade 4 changes  throughout. Medial capsular release.  Loose bodies, remnants of menisci, spurring, and hypertrophic synovitis of ACL all resected.  Distal femur exposed. Intermedullary guide placed.  Distal cut 10 mm at 5 degrees of valgus.  Size to #9 component.  Definitive cuts made.  Trial put in place.  Initially went with a cruciate-retaining prosthesis, but because of PCL and soft tissue contracture posteriorly, PCL and posterior capsule were released, and definitive cuts were made for the posterior stabilizing component.  That trial fit as well.  The proximal tibia exposed.  Tibial spine was removed with the saw.  Intermedullary guide placed.  Proximal cut removing 6 mm with a 5 degree posterior slope cut.  Sized to a #9 component.  All recess examined.  All contracture released.  All remnants of menisci and loose bodies removed. Patella was sized, reamed, and drilled for a 28 mm component.  Trial was put in place throughout.  A #9 on the tibia, #9 on the femur.  With the 12 mm insert and the 28 mm patella, had full extension and full flexion, nicely balanced knee without lift off.  The patellofemoral joint however had lateral tracking which was markedly improved after lateral release which was performed with cautery from inside out.  Tibia was marked for appropriate rotation with the trials and then hand reamed for the tibial component.  All trials removed. Copious irrigation with the pulse irrigating device.  Cement prepared and placed on the tibial component which was hammered into place.  Polyethylene attached.  Femoral component seated and the knee reduced.  Patellar component firmly cemented into  place in the patella and excessive cement removed.  Once all the cement had hardened, the knee was reexamined.  Excellent motion, good stability, good alignment, and good tracking.  The wound was irrigated. A Hemovac was placed through separate stab wounds.  Arthrotomy closed with #1 Vicryl.  The  skin and subcutaneous tissue with Vicryl and staples.  The margins of the wound and the knee injected with Marcaine and Hemovac clamped. A sterile compressive dressing applied.  Tourniquet deflated and removed. Knee immobilizer applied.  Anesthesia reversed.  Brought to the recovery room.  Tolerated surgery well with no complications. Dictated by:   Loreta Ave, M.D. Attending Physician:  Colbert Ewing DD:  10/31/01 TD:  11/02/01 Job: [id number]

## 2010-10-31 NOTE — Op Note (Signed)
[place]  Patient:    Nichole Frank, Nichole Frank Visit Number: [id number] MRN: [id number]          Service Type: SUR Location: 5000 5030 01 Attending Physician:  Colbert Ewing Dictated by:   Loreta Ave, M.D. Proc. Date: 12/23/01 Admit Date:  12/23/2001                             Operative Report  PREOPERATIVE DIAGNOSIS:  End-stage degenerative arthritis, varus alignment, and flexion contracture, left knee.  POSTOPERATIVE DIAGNOSIS:  End-stage degenerative arthritis, varus alignment, and flexion contracture, left knee.  PROCEDURE:  Left total knee replacement.  Osteonics prosthesis.  Press-fit, posterior-stabilizing #9 femoral component.   Cemented #9 tibial component.  A 10 mm polyethylene insert.  Cemented, recessed, nonmetal-backed 28 mm patellar component.  Appropriate soft tissue balancing, including lateral release.  SURGEON:  Loreta Ave, M.D.  ASSISTANT:  Arlys John D. Petrarca, P.A.-C.  ANESTHESIA:  General.  ESTIMATED BLOOD LOSS:  Minimal.  SPECIMENS:  Excised bone and soft tissue.  CULTURES:  None.  COMPLICATIONS:  None.  DRESSING:  Soft compressive.  DRAINS:  Hemovac x2.  TOURNIQUET TIME:  70 minutes.  DESCRIPTION OF PROCEDURE:  Patient brought to the operating room and placed on the operating table in supine position.  After adequate anesthesia had been obtained, left knee examined.  A 5 degree flexion contracture.  Alignment in 5 degrees of varus, barely correctable to neutral.  Further flexion to almost 90 degrees.  Tourniquet applied, prepped and draped in the usual sterile fashion. Exsanguinated with elevation and Esmarch and tourniquet inflated to 375 mmHg because of the size of the patients leg.  Anterior incision above the patella down to the tibial tubercle.  Numerous thick layers of adipose tissue before exposing the knee.  Medial parapatellar arthrotomy.  Knee exposed.  Grade 4 changes  throughout.  Periarticular spurs, remnants of menisci and cruciate ligaments, and adhesions and excessive fat all debrided.  Distal femur exposed. The intramedullary guide placed.  Distal cut set at 5 degrees of valgus, removing 10 mm.  Sized to a #9 component.  Medial capsular release had already been done to help expose the knee and rebalance it.  Jigs put in place on the femur.  This fit the #9 component.  Definitive cuts made.  Trial put in place and found to fit well.  Trial removed.  Tibia exposed.  Tibial spine removed with a saw.  Intramedullary guide placed.  Proximal cut, removing 4-5 mm down to good bone off the deficient medial side.  A 5 degree posterior slope cut.  The patella was sized, reamed, and drilled for a 28 mm component. All trials put in place.  With the #9 on the femur, #9 on the tibia, and a 10 mm insert as well as the 28 patella, I had full extension, full flexion, a nicely-balanced knee without lift-off in flexion and with good stability.  A lateral release necessary to balance the patellofemoral joint.  This was performed with cautery, and afterwards she had good tracking.  The tibia was marked for appropriate rotation and then hand-reamed for the tibial component. All trials removed.  Copious irrigation with the pulse irrigating device. Cement prepared, placed on the tibial component, which was hammered into place.  Polyethylene attached.  Femoral component seated.  Knee reduced. Patellar component cemented in the drilled, recessed area.  Once the cement had hardened, the knee  was re-examined.  Full extension, full flexion, good stability, good alignment, good patellofemoral tracking.  Hemovac was placed, brought out through separate stab wounds.  Wound irrigated.  Arthrotomy closed with #1 Vicryl, skin and subcutaneous tissue with Vicryl and staples.  Margins of the wound injected with Marcaine, as was the knee, and the Hemovacs were clamped.  A sterile  compressive dressing applied.  Tourniquet deflated and removed.  Knee immobilizer applied.  Anesthesia reversed.  Brought to the recovery room.  He tolerated the surgery well with no complications. Dictated by:   Loreta Ave, M.D. Attending Physician:  Colbert Ewing DD:  12/23/01 TD:  12/26/01 Job: [id number]

## 2010-10-31 NOTE — Op Note (Signed)
NAME:  Nichole Frank, Nichole Frank             ACCOUNT NO.:  [id number]   MEDICAL RECORD NO.:  [id number]          PATIENT TYPE:  INP   LOCATION:  0155                         FACILITY:  [facility]   PHYSICIAN:  Sandria Bales. Ezzard Standing, M.D.  DATE OF BIRTH:  May 14, 1951   DATE OF PROCEDURE:  04/08/2004  DATE OF DISCHARGE:                                 OPERATIVE REPORT   PREOPERATIVE DIAGNOSIS:  Morbid obesity, status post laparoscopic Roux-en-Y  gastric bypass.   POSTOPERATIVE DIAGNOSIS:  Morbid obesity, with a body mass index of  approximately 47, status post Roux-en-Y gastric bypass.   PROCEDURE:  Esophagogastroscopy.   SURGEON:  Sandria Bales. Ezzard Standing, M.D.   ANESTHESIA:  General endotracheal.   INDICATIONS FOR PROCEDURE:  Ms. Morace is a 60 year old white female who has  completed a laparoscopic Roux-en-Y gastrojejunostomy by Dr. Luretha Murphy.  I am going to do an upper endoscopy to document the patency of the  anastomosis, make sure there is no leak or bleeding from his stomach pouch.    Surgical Procedure:   With the patient in the supine position, a flexible Olympus endoscope was  passed without difficulty down her esophagus into her stomach.  The  gastrojejunal anastomosis was noted at 45-46 cm.  Her esophagogastric  junction was right at 40 cm for about a 5-6 cm pouch.  The anastomosis was  widely patent.  There was no air leak with Dr. Daphine Deutscher flooding the upper  abdomen with saline and there was no evidence of bleeding from the stomach  pouch.  The esophagus itself was otherwise unremarkable.   The patient tolerated the procedure well.  Dr. Daphine Deutscher will dictated the Roux-  en-Y gastric bypass.     Davi   DHN/MEDQ  D:  04/08/2004  T:  04/08/2004  Job:  [id number]   cc:   [place] Daphine Deutscher, M.D.  [address], [address]  [place]  [place] [address]  Fax: [phone]

## 2010-10-31 NOTE — Discharge Summary (Signed)
NAME:  ALTIE, SAVARD             ACCOUNT NO.:  [id number]   MEDICAL RECORD NO.:  [id number]          PATIENT TYPE:  INP   LOCATION:  0448                         FACILITY:  [facility]   PHYSICIAN:  Thorn[place] Park. Daphine Deutscher, MD  DATE OF BIRTH:  04/15/1951   DATE OF ADMISSION:  04/19/2004  DATE OF DISCHARGE:  04/24/2004                                 DISCHARGE SUMMARY   ADMITTING DIAGNOSIS:  Recurrent abdominal pain 10 days out from Roux-Y  gastric bypass done laparoscopically.   HOSPITAL COURSE:  Durinda Buzzelli was admitted by Dr. Jamey Ripa after she bent  down and then stood up and felt sharp pain in her upper abdomen.  This  persisted, and she had a CT scan which showed inflammatory changes at her  jejunojejunostomy.  She was begun on __________.  Following rehydration, her  white count went into the normal range, but her hemoglobin drifted down  slightly.  Follow up CT scan showed improvement, and symptomatically she had  no symptoms of obstruction at all.  Able to take all the contrast easily.  She was tolerating liquids well, and these were gradually returned to her  bariatric diet.  She was ready for discharge on October 10.  Condition  improved.  She was asked to keep her appointment made prior to this episode  for some time in December but knows to call next week and let us know how  she is doing, and we can see her sooner if needed.   IMPRESSION:  Status post laparoscopic Roux-Y gastric bypass with possible  hemorrhage at jejunojejunostomy producing pain and ileus.     Matt   MBM/MEDQ  D:  04/24/2004  T:  04/24/2004  Job:  [id number]   cc:   Dario Guardian, M.D.  [address]  [place]  [place] [address]  Fax: [phone]

## 2010-10-31 NOTE — Discharge Summary (Signed)
[place]  Patient:    Nichole Frank, Nichole Frank Visit Number: [id number] MRN: [id number]          Service Type: SUR Location: 5000 5028 01 Attending Physician:  Colbert Ewing Dictated by:   Oris Drone Petrarca, P.A.-C. Admit Date:  10/31/2001 Discharge Date: 11/08/2001                             Discharge Summary  ADMISSION DIAGNOSIS:  Degenerative joint disease of the right knee.  DISCHARGE DIAGNOSES: 1. Degenerative joint disease of the right knee. 2. Hypertension. 3. Exogenous obesity.  PROCEDURE:  Right total knee replacement.  HISTORY OF PRESENT ILLNESS:  [age]-year-old white single female with significant pain in her right knee with progressive DJD.  She has failed with conservative treatment.  She has now progressed with significant arthritic changes, especially in the patellofemoral and medial compartment of the right knee, having difficulty with activities of daily living, indicated now for right total knee replacement.  HOSPITAL COURSE:  [age]-year-old white female, admitted Oct 31, 2001, appropriate laboratory studies were obtained, as well as 1 g of Ancef IV on call to the operating room and was taken to the operating room where she underwent a right total knee replacement.  She tolerated the procedure well. She was placed with an epidural catheter for postoperative pain.  Foley was placed intraoperatively.  She was started on heparin 5000 units subcu q.12h. until her Coumadin became therapeutic.  Consultations with PT, OT and rehab were made, ambulating weightbearing as tolerated on the right.  Knee immobilizer when walking was instituted, CPM of 0 to 30 degrees for 8 to 12 hours per day, increasing 10 degrees per day.  She did have some problems with her epidural; this was discontinued on the 20th and a PCA Dilaudid pump was ordered.  She was allowed out of bed to a chair on the 20th.  Dressings were changed on  the 21st.  She was advanced to a house diet.  Remainder of her hospital course was noted to have treatment of hypokalemia and this was corrected by oral potassium.  Doppler of the right lower extremity was unremarkable.  She was started on Bextra 20 mg one daily on the 24th. Physical therapy continued during her hospital course and once she was ambulatory and proficiently stable, was discharged on the 27th to return back to the office in one week for followup.   LABORATORY AND ACCESSORY DATA:  Doppler studies showed no evidence of DVT, SVT or Bakers cyst.  There was an enlarged lymph node noted.  EKG showed sinus bradycardia, otherwise, normal.  Radiographic studies, Oct 31, 2001:  Right knee reveals satisfactory alignment following right total knee replacement.  Chest x-ray of January 31, 1999 revealed no evidence for acute pulmonary disease.  Preop hemoglobin 13.8, hematocrit 40.2%, white count 8100, platelets 295,000; discharge hemoglobin 8.4, hematocrit 24.3%, white count 7500, platelets 351,000.  Preop chemistries:  Sodium 139, potassium 3.7, chloride 103, CO2 30, glucose 117, BUN 14, creatinine 0.8, calcium 9.1; total protein 6.6, albumin 3.6, AST 22, ALT 23, ALP 77, total bilirubin 0.4.  Discharge sodium 138, potassium 4.0, chloride 103, CO2 29, glucose of 97, BUN 12, creatinine 0.7 and calcium was 8.4.  Glycosylated hemoglobin was 4.9.  Urinalysis showed a few epithelials, 0-2 white cells; no bacteria were seen.  She is blood type O-positive, antibody screen negative.  DISCHARGE MEDICATIONS:  She was given  a prescription for: 1. Percocet 5/325 mg one to two tabs q.4h. p.r.n. pain. 2. Robaxin 500 mg one p.o. q.8h. p.r.n. spasms. 3. Coumadin as per Genevieve Norlander.  DISCHARGE INSTRUCTIONS:  She will be up ad lib, weightbearing as tolerated with her knee immobilizer and her walker.  No restrictions on her diet.  Keep her wound clean and dry, changing the dressing daily.  Notify if  increased pain, swelling, redness, drainage or temperature greater than 101.5.  FOLLOWUP:  She will follow back up with [place] in one week for staple removal.  CONDITION ON DISCHARGE:  She was discharged in improved condition. Dictated by:   Oris Drone Petrarca, P.A.-C. Attending Physician:  Colbert Ewing DD:  12/01/01 TD:  12/03/01 Job: [id number]

## 2011-01-07 ENCOUNTER — Encounter (INDEPENDENT_AMBULATORY_CARE_PROVIDER_SITE_OTHER): Payer: Self-pay | Admitting: General Surgery

## 2011-01-08 ENCOUNTER — Ambulatory Visit (INDEPENDENT_AMBULATORY_CARE_PROVIDER_SITE_OTHER): Payer: BC Managed Care – PPO | Admitting: Surgery

## 2011-01-08 ENCOUNTER — Other Ambulatory Visit (INDEPENDENT_AMBULATORY_CARE_PROVIDER_SITE_OTHER): Payer: Self-pay | Admitting: Surgery

## 2011-01-08 ENCOUNTER — Encounter (INDEPENDENT_AMBULATORY_CARE_PROVIDER_SITE_OTHER): Payer: Self-pay | Admitting: Surgery

## 2011-01-08 DIAGNOSIS — R635 Abnormal weight gain: Secondary | ICD-10-CM

## 2011-01-08 DIAGNOSIS — Z9884 Bariatric surgery status: Secondary | ICD-10-CM

## 2011-01-08 NOTE — Patient Instructions (Signed)
Move carbs out of the diet See Amy Henreitta Leber about dietary intervention Obtain UGI to assess pouch size. You may be a candidate for lapband over bypass

## 2011-01-08 NOTE — Progress Notes (Signed)
Nichole Frank comes in today and she is 7.8 years post lap gastric bypass. She has had numerous setbacks in her life include. She has regained all of the weight that she had lost (currently 316.8).  She does not feel restriction and was wondering about pouch stretching.    She is frustrated.  She has tried oral weight loss meds that have not worked.   Will get her to see Royal Hawthorn and will get an UGI to assess her pouch.   Return in 3 months to assess weight loss.  She might be a candidate for band over bypass

## 2011-01-09 ENCOUNTER — Ambulatory Visit
Admission: RE | Admit: 2011-01-09 | Discharge: 2011-01-09 | Disposition: A | Discharge status: Home / Self Care | Payer: BC Managed Care – PPO | Source: Ambulatory Visit | Attending: Surgery | Admitting: Surgery

## 2011-01-09 ENCOUNTER — Other Ambulatory Visit (INDEPENDENT_AMBULATORY_CARE_PROVIDER_SITE_OTHER): Payer: Self-pay | Admitting: Surgery

## 2011-01-09 ENCOUNTER — Other Ambulatory Visit (INDEPENDENT_AMBULATORY_CARE_PROVIDER_SITE_OTHER): Payer: Self-pay | Admitting: General Surgery

## 2011-01-09 DIAGNOSIS — E669 Obesity, unspecified: Secondary | ICD-10-CM

## 2011-01-09 DIAGNOSIS — R635 Abnormal weight gain: Secondary | ICD-10-CM

## 2011-01-14 ENCOUNTER — Telehealth (INDEPENDENT_AMBULATORY_CARE_PROVIDER_SITE_OTHER): Payer: Self-pay | Admitting: Surgery

## 2011-04-13 ENCOUNTER — Encounter (INDEPENDENT_AMBULATORY_CARE_PROVIDER_SITE_OTHER): Payer: Self-pay | Admitting: Surgery

## 2011-04-21 ENCOUNTER — Encounter: Payer: BC Managed Care – PPO | Attending: Family Medicine | Admitting: *Deleted

## 2011-04-21 ENCOUNTER — Encounter: Payer: Self-pay | Admitting: *Deleted

## 2011-04-21 DIAGNOSIS — Z09 Encounter for follow-up examination after completed treatment for conditions other than malignant neoplasm: Secondary | ICD-10-CM | POA: Insufficient documentation

## 2011-04-21 DIAGNOSIS — Z713 Dietary counseling and surveillance: Secondary | ICD-10-CM | POA: Insufficient documentation

## 2011-04-21 DIAGNOSIS — Z9884 Bariatric surgery status: Secondary | ICD-10-CM | POA: Insufficient documentation

## 2011-04-21 NOTE — Progress Notes (Signed)
  Follow-up visit: 7 Years Post-Operative Gastric Bypass Surgery  Medical Nutrition Therapy:  Appt start time: 1115 end time:  1230.  Assessment:  Primary concerns today: post-operative bariatric surgery nutrition management. **Pt notes that she prefers to be called Nichole Frank. Nichole Frank reports that she had her Gastric Bypass surgery >7 years ago. 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 She understands the dietary principles of the post-op GBP diet as well as overall healthy eating yet has "turned off that part of my brain" since she has been dealing with this stress. Pt complains of not having anyone to support her in her efforts. After discussion of dietary habits, revised meal plan, and goals for the future, pt seems encouraged and plans to follow up @ [facility] monthly.  Weight today: 319.7 lbs Weight change: 94.7 lbs Total weight lost: 31.7 lbs (kept off) BMI: 50.2 Weight goal: 180-225 lbs  Surgery date: 03/2004 Start weight at [facility]: N/A Pre surgery weight: 351 lbs Lowest weight s/p surgery: 225 lbs (maintained 2-4 years)  24-hr recall: MWF:  B (5:30 AM): ProJoe Protein (30g) Snk (7:30  AM): Bacon/Egg Biscuit, small hashbrown ([employer])  L (1 PM): 1 Chicken Skewers, pita bread, salad ([place] restaurant) OR Cheeseburger ([employer]) Snk (3-4 PM): N/A OR Fiber One Brownie D (5-6 PM): Cheeseburger ([employer]) Snk (10:30 PM): ProJoe Protein (30g) OR Potato Chips OR Cereal w/ almond milk  T/Th: B (8 AM): ProJoe Protein (30g) Snk (10 AM): Munchkin's doughnuts (1-5) L (1-2 PM): 1 Chicken Skewers, pita bread, salad ([place] restaurant) OR Burrito ([employer]) OR Ham sandwich on thins Snk (3-4 PM): N/A  D (7-8 PM): Leftovers OR Ham sandwich Snk (10:30 PM): N/A  Emotional eating food choices: snack size candy bars, chips  Fluid intake: diet sodas  (32oz), ProJoe Protein, water, Brisk Orangeade, Coffee = 40-60 oz Estimated total protein intake: 60-75g  Medications: See updated medication list Supplementation: Taking MVI, Chewable iron w/ Vit C, Calcium Citrate, B12  Using straws: No Drinking while eating: No Hair loss: No Carbonated beverages: No N/V/D/C: Diarrhea (pt related this to stress) Dumping syndrome: No  Recent physical activity:  [employer]: 6 classes/week  Progress Towards Goal(s):  In progress.  Energy recommendations: 1000-1200 Calories 90 g Protein <100 g Carbohydrate <50g g Fat 64 oz Fluids  Handouts given during visit include:  Pre-Op Diet  Carbohydrate Counting Card  BD's Fast Food Guide   Nutritional Diagnosis:  NI-5.8.2 Excessive carbohydrate intake As related to increased intake of concentrated sweets, breads, and fast foods.  As evidenced by pt consuming >100% estimated carb needs with significant weight gain s/p Gastric Bypass Surgery.    Intervention:  Nutrition education.  Monitoring/Evaluation:  Dietary intake, exercise, and body weight. Follow up in 1 months for 7 year post-op visit.

## 2011-04-21 NOTE — Patient Instructions (Signed)
Goals:  Follow Phase 3B: High Protein + Non-Starchy Vegetables  OR alternatively the Pre-Op Diet  Eat 3-6 small meals/snacks, every 3-5 hrs  Limit Meal Skipping  Increase lean protein foods to meet 80-95g goal  Increase fluid intake to 64oz + (non carbonated)  Limit carbohydrate intake to 15-20 grams of carbohydrate (fruit, whole grain, starchy vegetable) with meals  Use carb counting guides/fast food guides for carb counts  Avoid drinking 15 minutes before, during and 30 minutes after eating  Continue regular exercise    

## 2011-05-21 ENCOUNTER — Encounter: Payer: BC Managed Care – PPO | Attending: Surgery | Admitting: *Deleted

## 2011-05-21 DIAGNOSIS — Z9884 Bariatric surgery status: Secondary | ICD-10-CM | POA: Insufficient documentation

## 2011-05-21 DIAGNOSIS — Z713 Dietary counseling and surveillance: Secondary | ICD-10-CM | POA: Insufficient documentation

## 2011-05-21 DIAGNOSIS — Z09 Encounter for follow-up examination after completed treatment for conditions other than malignant neoplasm: Secondary | ICD-10-CM | POA: Insufficient documentation

## 2011-05-21 NOTE — Patient Instructions (Signed)
Goals:  Follow Phase 3B: High Protein + Non-Starchy Vegetables  OR alternatively the Pre-Op Diet  Eat 3-6 small meals/snacks, every 3-5 hrs  Limit Meal Skipping  Increase lean protein foods to meet 80-95g goal  Increase fluid intake to 64oz + (non carbonated)  Limit carbohydrate intake to 15-20 grams of carbohydrate (fruit, whole grain, starchy vegetable) with meals  Use carb counting guides/fast food guides for carb counts  Avoid drinking 15 minutes before, during and 30 minutes after eating  Continue regular exercise

## 2011-05-21 NOTE — Progress Notes (Signed)
  Follow-up visit: 7 Years Post-Operative Gastric Bypass Surgery  Medical Nutrition Therapy:  Appt start time: 1130 end time:  1200.  Assessment:  Primary concerns today: post-operative bariatric surgery nutrition management.  Weight today: 319.7 lbs Weight change: 1.4 lb gain Total weight lost: 30.3 lbs lost total BMI: 50.2% Weight goal: 180-225 lbs   Surgery date: 03/2004  Start weight at [facility]: N/A  Pre surgery weight: 351 lbs  Lowest weight s/p surgery: 225 lbs (maintained 2-4 years)  24-hr recall:  B (AM): 2 hard boiled egg, 2 meatballs OR 2 egg, 1 sausage patty OR 1/3 breakfast bowl ([employer]ee[employer]) Snk (AM): Pro-Joe Protein Shake (20g)   L (12-1 PM): Ham/cheese roll-up (3-4 oz protein) OR 3 chicken fingers OR Flatbread sandwich w/ deli meat Snk (PM): Protein "brittle" (6 grams protein)  D (6-7 PM): Roasted pork, red cabbage OR Green salad w/ protein (4 oz protein) Snk (PM): n/a OR Flatbread pb&j (sf jelly)  Fluid intake: Diet Dr. Reino Kent (1/day), SF Orangeade, Water = 40-50 oz Estimated total protein intake: 50-75g  Medications: No changes Supplementation: Taking appropriate supplements regularly  Using straws: No Drinking while eating: No Hair loss: No Carbonated beverages: One/day (12 oz) N/V/D/C: No Dumping syndrome: None reported  Recent physical activity:  Limited due to recent fall and knee injury  Progress Towards Goal(s):  In progress.   Nutritional Diagnosis:  NI-5.8.2 Excessive carbohydrate intake As related to increased intake of concentrated sweets, breads, and fast foods. As evidenced by pt consuming >100% estimated carb needs with significant weight gain s/p Gastric Bypass Surgery.    Intervention:  Nutrition education.  Monitoring/Evaluation:  Dietary intake, exercise, lap band fills, and body weight. Follow up in 2 months for  post-op visit.

## 2011-08-03 ENCOUNTER — Encounter: Payer: BC Managed Care – PPO | Attending: Surgery | Admitting: *Deleted

## 2011-08-03 ENCOUNTER — Encounter: Payer: Self-pay | Admitting: *Deleted

## 2011-08-03 DIAGNOSIS — Z9884 Bariatric surgery status: Secondary | ICD-10-CM | POA: Insufficient documentation

## 2011-08-03 DIAGNOSIS — Z09 Encounter for follow-up examination after completed treatment for conditions other than malignant neoplasm: Secondary | ICD-10-CM | POA: Insufficient documentation

## 2011-08-03 DIAGNOSIS — Z713 Dietary counseling and surveillance: Secondary | ICD-10-CM | POA: Insufficient documentation

## 2011-08-03 NOTE — Patient Instructions (Signed)
Goals:  Follow Phase 3B: High Protein + Non-Starchy Vegetables  OR alternatively the Pre-Op Diet  Eat 3-6 small meals/snacks, every 3-5 hrs  Limit Meal Skipping  Increase lean protein foods to meet 80-95g goal  Increase fluid intake to 64oz + (non carbonated)  Limit carbohydrate intake to 15-20 grams of carbohydrate (fruit, whole grain, starchy vegetable) with meals  Use carb counting guides/fast food guides for carb counts  Avoid drinking 15 minutes before, during and 30 minutes after eating  Continue regular exercise    

## 2011-08-03 NOTE — Progress Notes (Signed)
Follow-up visit: 7 Years Post-Operative Gastric Bypass Surgery  Medical Nutrition Therapy:  Appt start time: 1130 end time:  1200.  Primary concerns today: Post-operative bariatric surgery nutrition management. Pt reports excessive stress level at work. Trying to decrease carbs (rice, pasta, etc) and for Nichole Frank trying to give up diet soda. Watching portions, but continues to consume fast food during week d/t school. "I just can't seem to take my lunch with me". Has been trying to d/c snacking, but states she is a "stress eater".  Weight today: 315.8 lbs Weight change: 3.9 lb loss Total weight lost: 34.2 lbs lost total BMI: 49.4% Weight goal: 180-225 lbs   Surgery date: 03/2004  Start weight at [facility]: N/A  Pre surgery weight: 351 lbs  Lowest weight s/p surgery: 225 lbs (maintained 2-4 years)  24-hr recall: Snk (6-6:30 AM): Pro-Joe Protein Shake (20g) B (8-8:30 AM): 2 sausage patties (~10g), 1 hash round (Hardee's) L (12-1 PM): 1/2 beef burrito (~10g), mac-n-cheese, 1/2 grilled cheese panini, and 1 scoop chicken salad (~12-14g)  Snk (PM): n/a  D (6-7 PM): Lg Wendy's chili (26g protein) Snk (PM): n/a   Fluid intake:  Diet Dr. Reino Kent (~30-35 oz/day), Pro Joe (1/day), SF Orangeade, Water = 64 oz Estimated total protein intake: ~80g  Medications: No changes Supplementation: Taking appropriate supplements regularly  Using straws: Yes Drinking while eating: Yes. Working on discontinuing. 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  Progress Towards Goal(s):  In progress.   Nutritional Diagnosis:  NI-5.8.2 Excessive carbohydrate intake related to increased intake of concentrated sweets, breads, and fast foods as evidenced by pt consuming >100% estimated carb needs with significant weight gain s/p Gastric Bypass Surgery.      Intervention:  Nutrition education.  Monitoring/Evaluation:  Dietary intake, exercise, and body weight. Follow up in 4 weeks for post-op visit.

## 2011-08-31 ENCOUNTER — Ambulatory Visit: Payer: BC Managed Care – PPO | Admitting: *Deleted

## 2011-09-24 ENCOUNTER — Encounter: Payer: Self-pay | Admitting: *Deleted

## 2011-09-24 ENCOUNTER — Encounter: Payer: BC Managed Care – PPO | Attending: Surgery | Admitting: *Deleted

## 2011-09-24 DIAGNOSIS — Z9884 Bariatric surgery status: Secondary | ICD-10-CM | POA: Insufficient documentation

## 2011-09-24 DIAGNOSIS — Z09 Encounter for follow-up examination after completed treatment for conditions other than malignant neoplasm: Secondary | ICD-10-CM | POA: Insufficient documentation

## 2011-09-24 DIAGNOSIS — Z713 Dietary counseling and surveillance: Secondary | ICD-10-CM | POA: Insufficient documentation

## 2011-09-24 NOTE — Patient Instructions (Addendum)
Goals:  Follow Phase 3B: High Protein + Non-Starchy Vegetables  OR alternatively the Pre-Op Diet  Eat 3-6 small meals/snacks, every 3-5 hrs  Limit Meal Skipping  Increase lean protein foods to meet 80g goal  Increase fluid intake to 64oz + (non carbonated)  Limit carbohydrate intake to 15-20 grams of carbohydrate (fruit, whole grain, starchy vegetable) with meals  Use carb counting guides/fast food guides for carb counts  Avoid drinking 15 minutes before, during and 30 minutes after eating  Resume regular exercise as able.

## 2011-09-24 NOTE — Progress Notes (Signed)
Follow-up visit:  7 Years Post-Operative Gastric Bypass Surgery  Medical Nutrition Therapy:  Appt start time: 1130 end time:  1200.  Primary concerns today: Post-operative bariatric surgery nutrition management. Pt reports she has been sick for the last week with a severe case of bronchitis; has just been started on prednisone. She has also gained 8 lbs since last visit 08/03/11. States as she feels better and when school is over in July '13, she will be able to make the changes she needs to make.  Continues to have highly increased stress levels with boss.  Weight today: 322.0 lbs Weight change: 6.2 lb gain Total weight lost: 28 lbs lost total BMI: 49.7% Weight goal: 180-225 lbs   Surgery date: 03/2004  Start weight at [facility]: N/A  Pre surgery weight: 351 lbs  Lowest weight s/p surgery: 225 lbs (maintained 2-4 years)  24-hr recall: Snk (6-6:30 AM): n/a B (8-8:30 AM): 2 eggs, flat bagel round L (12-1 PM): hot tea, diet dr pepper, diet ginger ale  Snk (PM): n/a  D (6-7 PM): 2 hotdogs w/ no bun, applesauce Snk (PM): n/a   Fluid intake: < 64 oz Estimated total protein intake: < 60 g  Medications:  Reports not taking any medications d/t illness  Supplementation: Not taking d/t illness  Using straws: Yes Drinking while eating: Yes. Continues to work on discontinuing. Hair loss: No Carbonated beverages: 12-20 oz/day N/V/D/C: Diarrhea r/t illness (per pt); Gas when she coughs Dumping syndrome: None reported  Recent physical activity:  None d/t illness  Progress Towards Goal(s):  In progress.   Nutritional Diagnosis:  NI-5.8.2 Excessive carbohydrate intake related to increased intake of concentrated sweets, breads, and fast foods as evidenced by pt consuming >100% estimated carb needs with significant weight gain s/p Gastric Bypass Surgery.    Intervention:  Nutrition education.  Monitoring/Evaluation:  Dietary intake, exercise, and body weight. Follow up in 2 months for post-op  visit.

## 2011-09-28 ENCOUNTER — Ambulatory Visit: Payer: BC Managed Care – PPO | Admitting: *Deleted

## 2011-11-24 ENCOUNTER — Encounter: Payer: BC Managed Care – PPO | Attending: Surgery | Admitting: *Deleted

## 2011-11-24 ENCOUNTER — Encounter: Payer: Self-pay | Admitting: *Deleted

## 2011-11-24 DIAGNOSIS — Z713 Dietary counseling and surveillance: Secondary | ICD-10-CM | POA: Insufficient documentation

## 2011-11-24 DIAGNOSIS — Z09 Encounter for follow-up examination after completed treatment for conditions other than malignant neoplasm: Secondary | ICD-10-CM | POA: Insufficient documentation

## 2011-11-24 DIAGNOSIS — Z9884 Bariatric surgery status: Secondary | ICD-10-CM | POA: Insufficient documentation

## 2011-11-24 NOTE — Progress Notes (Signed)
Follow-up visit:  7 Years Post-Operative Gastric Bypass Surgery  Medical Nutrition Therapy:  Appt start time: 1130 end time:  1200.  Primary concerns today: Post-operative bariatric surgery nutrition management. Nichole Frank returns today for f/u with a weight loss of 3.5 lbs, however Tanita scan shows she has lost ~15 lbs of fat mass and an 8.5 lb gain of total body water. She feels better when learning this. She continues to struggle with diarrhea and constipation d/t IBS and stress from school.   Weight today: 318.5 lbs Weight change: 3.5 lb loss Total weight lost: 31.5 lbs  BMI: 48.4 kg/m^2 Weight goal: 180-225 lbs   Surgery date: 03/2004  Start weight at [facility]: N/A  Pre surgery weight: 351 lbs  Lowest weight s/p surgery: 225 lbs (maintained 2-4 years)  TANITA  BODY COMP RESULTS  09/24/11 11/24/11   %Fat 50.6% 46.5%   Fat Mass (lbs) 163.0 148.0   Fat Free Mass (lbs) 159.0 170.5   Total Body Water (lbs) 116.5 125.0   24-hr recall: Snk (6-6:30 AM): Protein shake (Pro Joe) B (8-8:30 AM): 2 sausage patties w/ sm hash round ([employer]ees) OR Pro Joe (T/Th) L (12-1 PM): 2 oz burger, FF or side salad OR Chicken skewers OR Little Thick cheeseburger w/ bun Snk: (3 PM): Kindness bar Snk (5-6 PM): Oh Yeah shake (32g) - School nights (M/W) D (6-7 PM): Hamburger pattie, 1-2 all beef hot dog (no bun when at home); applesauce Snk (11 PM): Pro Joe (M/W school nights) OR chips, wheat thins, or kindness bar Beverages: diet dr pepper (watered down), green tea acai berry  Fluid intake: < 64 oz Estimated total protein intake: < 60 g  Medications:  Has resumed taking regularly  Supplementation: Has resumed taking regularly  Using straws: Yes Drinking while eating: Yes. Continues to work on discontinuing. 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   Progress Towards Goal(s):  In progress.   Nutritional Diagnosis:  NI-5.8.2 Excessive carbohydrate intake related to increased intake of concentrated sweets, breads, and fast foods as evidenced by pt consuming >100% estimated carb needs with significant weight gain s/p Gastric Bypass Surgery.    Intervention:  Nutrition education.  Monitoring/Evaluation:  Dietary intake, exercise, and body weight. Follow up in 2 months for post-op visit.

## 2011-11-24 NOTE — Patient Instructions (Addendum)
*  Continue previous goals.  TANITA  BODY COMP RESULTS  09/24/11 11/24/11   %Fat 50.6% 46.5%   Fat Mass (lbs) 163.0 148.0   Fat Free Mass (lbs) 159.0 170.5   Total Body Water (lbs) 116.5 125.0

## 2012-01-28 ENCOUNTER — Ambulatory Visit: Payer: BC Managed Care – PPO | Admitting: *Deleted

## 2012-03-02 ENCOUNTER — Encounter: Payer: Self-pay | Admitting: *Deleted

## 2012-03-02 ENCOUNTER — Encounter: Payer: BC Managed Care – PPO | Attending: Surgery | Admitting: *Deleted

## 2012-03-02 DIAGNOSIS — Z713 Dietary counseling and surveillance: Secondary | ICD-10-CM | POA: Insufficient documentation

## 2012-03-02 DIAGNOSIS — Z9884 Bariatric surgery status: Secondary | ICD-10-CM | POA: Insufficient documentation

## 2012-03-02 DIAGNOSIS — Z09 Encounter for follow-up examination after completed treatment for conditions other than malignant neoplasm: Secondary | ICD-10-CM | POA: Insufficient documentation

## 2012-03-02 NOTE — Progress Notes (Addendum)
Follow-up visit:  7 Yrs/10 Mo Post-Operative Gastric Bypass Surgery  Medical Nutrition Therapy:  Appt start time: 1230 end time:  1315.  Primary concerns today: Post-operative bariatric surgery nutrition management. Nichole Frank comes in today for f/u. Reports she finished school in August, though high stress levels continue at work.  Nichole Frank continues to eat fast food ("I just have to get away from the office at lunch") despite discussion at previous visits that it will likely slow her weight loss.  Discussed other options she can take to work.  Is still trying to increase her fluid intake. Reports taking supplements consistently.  Surgery date: 03/2004  Start weight at [facility]: N/A  Pre surgery weight: 351 lbs  Lowest weight s/p surgery: 225 lbs (maintained 2-4 years)  Weight today: 322.5 lbs Weight change: 3.0 lb gain Total weight lost: 28.5 lbs  BMI: 49.8 kg/m^2  Weight goal: 180-225 lbs  % goal met: ~20%  TANITA  BODY COMP RESULTS  09/24/11 11/24/11 03/02/12   %Fat 50.6% 46.5% 50.1%   Fat Mass (lbs) 163.0 148.0 161.5   Fat Free Mass (lbs) 159.0 170.5 161.0   Total Body Water (lbs) 116.5 125.0 118.0   24-hr recall: Snk (6-6:30 AM): Pro Joe protein shake (20g) OR NONE (T/Th) B (8-8:30 AM): (MWF) 2 sausage patties w/ sm hash round ([employer]) OR Pro Joe protein shake (T/Th) - 20g L (12-1 PM): 2 oz burger w/ FF or side salad OR McD's Mighty Wings (3 oz w/ breading taken off) Snk: (3 PM): Kindness bar D (6-7 PM): Pro Joe protein shake (20g) OR SF chocolate  Snk (11 PM): Pro Joe protein shake (20g) - sometimes Beverages: Diet dr pepper (watered down) 16 oz, green tea acai berry, 32 oz other protein shake (T/Th instead of Pro Joe)  Fluid intake: < 64 oz Estimated total protein intake:  75-90 g  Medications:  No changes from previous visit.  Supplementation:  Taking regularly  Using straws: Yes Drinking while eating: Yes; Still working to discontinue. 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   Progress Towards Goal(s):  In progress.   Nutritional Diagnosis:  NI-5.8.2 Excessive carbohydrate intake related to increased intake of concentrated sweets, breads, and fast foods as evidenced by pt consuming >100% estimated carb needs with lack of weight loss s/p Gastric Bypass Surgery.    Intervention:  Nutrition education/reinforcement.  Monitoring/Evaluation:  Dietary intake, exercise, and body weight. Follow up in 2 months for post-op visit.

## 2012-03-02 NOTE — Patient Instructions (Addendum)
   Continue previous goals.  Try [employer]'s "Rev" wraps for lunch.  Limit meals away from home.  Increase fluids to >64 oz daily.

## 2012-05-03 ENCOUNTER — Ambulatory Visit: Payer: BC Managed Care – PPO | Admitting: *Deleted

## 2012-06-29 ENCOUNTER — Ambulatory Visit: Payer: Self-pay | Admitting: *Deleted

## 2012-07-07 ENCOUNTER — Encounter: Payer: BC Managed Care – PPO | Attending: Surgery | Admitting: *Deleted

## 2012-07-07 ENCOUNTER — Encounter: Payer: Self-pay | Admitting: *Deleted

## 2012-07-07 DIAGNOSIS — Z713 Dietary counseling and surveillance: Secondary | ICD-10-CM | POA: Insufficient documentation

## 2012-07-07 DIAGNOSIS — Z09 Encounter for follow-up examination after completed treatment for conditions other than malignant neoplasm: Secondary | ICD-10-CM | POA: Insufficient documentation

## 2012-07-07 DIAGNOSIS — Z9884 Bariatric surgery status: Secondary | ICD-10-CM | POA: Insufficient documentation

## 2012-07-07 NOTE — Progress Notes (Signed)
Follow-up visit:  7+ Years  Post-Operative Gastric Bypass Surgery  Medical Nutrition Therapy:  Appt start time: 1230 end time:  1315.  Primary concerns today: Post-operative bariatric surgery nutrition management. Nichole Frank comes in today for f/u with 3 lb wt gain through the holidays. Reports she was up to 331 lbs at Christmas. Was sick last week and reports decreased appetite. Is ready to "get back on track".  Has cut diet Dr. Reino Kent intake down to a few times/week. Reading Donald Pore Cruise's new book "Happy Hormones: slim belly" and may follow diet.   Surgery date: 03/2004  Pre surgery weight: 351 lbs  Lowest weight s/p surgery: 225 lbs (maintained 2-4 years)  Weight today: 326.0 lbs Weight change: 3.5 lb GAIN Total weight lost: 25.0 lbs  BMI: 50.3 kg/m^2  Weight goal: 180-225 lbs  % goal met: ~19%  TANITA  BODY COMP RESULTS  09/24/11 11/24/11 03/02/12 07/07/12   %Fat 50.6% 46.5% 50.1% 50.1%   Fat Mass (lbs) 163.0 148.0 161.5 163.5   Fat Free Mass (lbs) 159.0 170.5 161.0 162.5   Total Body Water (lbs) 116.5 125.0 118.0 119.0   24-hr recall: Snk (6-6:30 AM): Pro Joe protein shake (20g) B (8-8:30 AM): (MWF) 2 sausage patties w/ sm hash round ([employer]ees) OR Pro Joe protein shake (T/Th) - 20g L (12-1 PM):  Yum-Yum's hotdog w/ bun Snk: (3 PM): NONE d/t being sick - usually [employer] D (6-7 PM): 1/2 order of Mrs. Winner's chicken & dumplings (comfort food when sick last week - decreased appetite)  Snk (11 PM): Pro Joe protein shake (20g) - sometimes Beverages: Diet dr pepper (watered down) 16 oz, green tea acai berry, 32 oz other protein shake (T/Th instead of Pro Joe)  Fluid intake: > 64 oz last 2 weeks - shakes, other, coffee Estimated total protein intake:  60-70 g  Medications:  No changes from previous visit.  Supplementation:  Taking regularly  Using straws: Yes Drinking while eating: Yes; Still working to discontinue. 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   Progress Towards Goal(s):  In progress.   Nutritional Diagnosis:  NI-5.8.2 Excessive carbohydrate intake related to increased intake of concentrated sweets, breads, and fast foods as evidenced by pt consuming >100% estimated carb needs with lack of weight loss s/p Gastric Bypass Surgery.    Intervention:  Nutrition education/reinforcement.  Monitoring/Evaluation:  Dietary intake, exercise, and body weight. Follow up in 1 months for post-op visit.

## 2012-07-07 NOTE — Patient Instructions (Addendum)
   Continue previous goals.  Limit meals away from home.  Watch food choices and limit carbs to 15 grams per meal/ 15 grams per snack  Continue current exercise regimen.    

## 2012-07-30 ENCOUNTER — Other Ambulatory Visit: Payer: Self-pay

## 2012-08-11 ENCOUNTER — Encounter: Payer: Self-pay | Admitting: *Deleted

## 2012-08-11 ENCOUNTER — Encounter: Payer: BC Managed Care – PPO | Attending: Surgery | Admitting: *Deleted

## 2012-08-11 DIAGNOSIS — Z9884 Bariatric surgery status: Secondary | ICD-10-CM | POA: Insufficient documentation

## 2012-08-11 DIAGNOSIS — Z09 Encounter for follow-up examination after completed treatment for conditions other than malignant neoplasm: Secondary | ICD-10-CM | POA: Insufficient documentation

## 2012-08-11 DIAGNOSIS — Z713 Dietary counseling and surveillance: Secondary | ICD-10-CM | POA: Insufficient documentation

## 2012-08-11 NOTE — Progress Notes (Signed)
Follow-up visit:  7+ Years  Post-Operative Gastric Bypass Surgery  Medical Nutrition Therapy:  Appt start time: 1230 end time:  1315.  Primary concerns today: Post-operative bariatric surgery nutrition management. Nichole Frank comes in today for f/u with additional 2.0 lb wt gain, though has lost 6 lbs of FAT MASS. Has stopped taking her lasix and is holding onto 4.5 lbs of fluid.  Reports she has been skipping meals d/t decreased intake from head cold. Also had flu last month. Has made some good changes, however, including eliminating [employer] for breakfast, etc.   Surgery date: 03/2004  Pre surgery weight: 351 lbs  Lowest weight s/p surgery: 225 lbs (maintained 2-4 years)  Weight today: 326.0 lbs Weight change: 2.0 lb GAIN (Though lost 4 lbs of FAT MASS) Total weight lost: 25.0 lbs  BMI: 50.3 kg/m^2  Weight goal: 180-225 lbs  % goal met: ~19%  TANITA  BODY COMP RESULTS  09/24/11 11/24/11 03/02/12 07/07/12 08/11/12   %Fat 50.6% 46.5% 50.1% 50.1% 48.7%   Fat Mass (lbs) 163.0 148.0 161.5 163.5 159.5   Fat Free Mass (lbs) 159.0 170.5 161.0 162.5 168.5   Total Body Water (lbs) 116.5 125.0 118.0 119.0 123.5   24-hr recall: Stopped getting [employer]ees in morning; decreased appetite d/t head cold.   Fluid intake: > 64 oz last 2 weeks - shakes, other, coffee Estimated total protein intake:  60-70 g  Medications:  Not taking Lasix, Raspberry Ketones, or [employer]. Added Mucinex, Flonase, OTC sinus med for head cold  Supplementation:  Taking regularly  Using straws: Yes Drinking while eating: Yes; Still working to discontinue. 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   Progress Towards Goal(s):  In progress.   Nutritional Diagnosis:  NI-5.8.2 Excessive carbohydrate intake related to increased intake of  concentrated sweets, breads, and fast foods as evidenced by pt consuming >100% estimated carb needs with lack of weight loss s/p Gastric Bypass Surgery.    Intervention:  Nutrition education/reinforcement.  Monitoring/Evaluation:  Dietary intake, exercise, and body weight. Follow up in 6 weeks for post-op visit.

## 2012-08-11 NOTE — Patient Instructions (Addendum)
   Continue previous goals.  Limit meals away from home.  Watch food choices and limit carbs to 15 grams per meal/ 15 grams per snack  Continue current exercise regimen.

## 2012-09-22 ENCOUNTER — Ambulatory Visit: Payer: BC Managed Care – PPO | Admitting: *Deleted

## 2012-10-05 ENCOUNTER — Encounter: Payer: BC Managed Care – PPO | Attending: Surgery | Admitting: *Deleted

## 2012-10-05 DIAGNOSIS — Z713 Dietary counseling and surveillance: Secondary | ICD-10-CM | POA: Insufficient documentation

## 2012-10-05 DIAGNOSIS — Z09 Encounter for follow-up examination after completed treatment for conditions other than malignant neoplasm: Secondary | ICD-10-CM | POA: Insufficient documentation

## 2012-10-05 DIAGNOSIS — Z9884 Bariatric surgery status: Secondary | ICD-10-CM | POA: Insufficient documentation

## 2012-10-05 NOTE — Patient Instructions (Addendum)
   Continue previous goals.  Follow diet alterations as discussed until next visit.  Watch food choices and limit carbs to 15 grams per meal/ 15 grams per snack  Continue current exercise regimen.

## 2012-10-05 NOTE — Progress Notes (Addendum)
Follow-up visit:  7+ Years  Post-Operative Gastric Bypass Surgery  Medical Nutrition Therapy:  Appt start time: 1130   end time:  1200.  Primary concerns today: Post-operative bariatric surgery nutrition management. Nichole Frank comes in today for f/u with additional 3.0 lb wt gain, though has lost 3 lbs of FAT MASS and holding onto 3 lbs of TBW (fluid). Increased stress d/t job Financial [employer]. Reports some emotional eating as a result.    Surgery date: 03/2004  Surgery type: RYGB Pre surgery weight: 351 lbs  Lowest weight s/p surgery: 225 lbs (maintained 2-4 years)  Weight today: 329.0 lbs Weight change: 3.0 lb GAIN (Though lost 3 lbs of FAT MASS; GAIN of 3 lbs of TBW) Total weight lost: 22.0 lbs  BMI: 50.8 kg/m^2  Weight goal: 180-225 lbs  % goal met: ~18%  TANITA  BODY COMP RESULTS  09/24/11 11/24/11 03/02/12 07/07/12 08/11/12 10/05/12   %Fat 50.6% 46.5% 50.1% 50.1% 48.7% 47.6%   Fat Mass (lbs) 163.0 148.0 161.5 163.5 159.5  156.5   Fat Free Mass (lbs) 159.0 170.5 161.0 162.5 168.5 172.5   Total Body Water (lbs) 116.5 125.0 118.0 119.0 123.5 126.5   24-hr recall:  [employer]ees one time a week; Increased ProJoe (shakes) to 2/day.     Fluid intake: > 64 oz last 2 weeks - shakes, other, coffee Estimated total protein intake:  60-70 g  Medications:  See med list Supplementation:  Taking regularly  Using straws: Yes Drinking while eating: Yes; Still working to discontinue. 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   Progress Towards Goal(s):  In progress.   Nutritional Diagnosis:  NI-5.8.2 Excessive carbohydrate intake related to increased intake of concentrated sweets, breads, and fast foods as evidenced by pt consuming >100% estimated carb needs s/p Gastric Bypass Surgery.    Intervention:  Nutrition  education/reinforcement.  Samples given during visit include:   Celebrate Iron + C (30 mg): 4 ea Lot: [id number]; Exp: 09/15  Celebrate Iron + C (60 mg): 8 ea Lot: [id number]; Exp: 08/14  Monitoring/Evaluation:  Dietary intake, exercise, and body weight. Follow up in 6 weeks for post-op visit.

## 2012-10-24 ENCOUNTER — Encounter: Payer: Self-pay | Admitting: *Deleted

## 2012-11-16 ENCOUNTER — Encounter: Payer: BC Managed Care – PPO | Attending: Surgery | Admitting: *Deleted

## 2012-11-16 ENCOUNTER — Encounter: Payer: Self-pay | Admitting: *Deleted

## 2012-11-16 DIAGNOSIS — Z09 Encounter for follow-up examination after completed treatment for conditions other than malignant neoplasm: Secondary | ICD-10-CM | POA: Insufficient documentation

## 2012-11-16 DIAGNOSIS — Z9884 Bariatric surgery status: Secondary | ICD-10-CM | POA: Insufficient documentation

## 2012-11-16 DIAGNOSIS — Z713 Dietary counseling and surveillance: Secondary | ICD-10-CM | POA: Insufficient documentation

## 2012-11-16 NOTE — Patient Instructions (Addendum)
   Continue previous goals.  Follow diet alterations as discussed until next visit.  Watch food choices and limit carbs to 15 grams per meal/ 15 grams per snack  Continue current exercise regimen.    

## 2012-11-16 NOTE — Progress Notes (Signed)
Follow-up visit:  9+ Years  Post-Operative Gastric Bypass Surgery  Medical Nutrition Therapy:  Appt start time: 1130   end time:  1200.  Primary concerns today: Post-operative bariatric surgery nutrition management. Nichole Frank returns today with 3.0 lb wt loss. Increased stress continues. Reports some emotional eating continues, but trying to get back on track. Still relies on shakes for much of her protein intake. Consistent exercise continues.   Surgery date: 03/2004  Surgery type: RYGB Pre surgery weight: 351 lbs  Lowest weight s/p surgery: 225 lbs (maintained 2-4 years)  Weight today: 326.0 lbs Weight change: 3.0 lbs (LOSS) Total weight lost: 25.0 lbs  BMI: 50.3 kg/m^2  Weight goal: 180-225 lbs  % goal met: ~18%  TANITA  BODY COMP RESULTS  09/24/11 11/24/11 03/02/12 07/07/12 08/11/12 10/05/12 11/16/12   %Fat 50.6% 46.5% 50.1% 50.1% 48.7% 47.6% 48.4%   Fat Mass (lbs) 163.0 148.0 161.5 163.5 159.5 156.5 158.0   Fat Free Mass (lbs) 159.0 170.5 161.0 162.5 168.5 172.5 168.0   Total Body Water (lbs) 116.5 125.0 118.0 119.0 123.5 126.5 123.0   24-hr recall:  Hardees one time a week; Increased ProJoe (shakes) to 1-2/day.  Has decreased eating at night and consumes mainly protein.  Has stopped bringing "junk" into the house. Plans to add 20 oz of water daily.  No meal skipping reported. Using MFP, but calories were set at 1500 cal. Advised to change to [phone] cal/day.   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   Nutritional Diagnosis:  NI-5.8.2 Excessive carbohydrate intake related to increased intake of concentrated sweets, breads, and fast foods as evidenced by pt consuming >100% estimated carb needs s/p Gastric Bypass Surgery.    Intervention:  Nutrition education/reinforcement.  Monitoring/Evaluation:  Dietary intake, exercise, and body weight. Follow up in 6 weeks for post-op visit.

## 2012-12-21 ENCOUNTER — Ambulatory Visit: Payer: BC Managed Care – PPO | Admitting: *Deleted

## 2012-12-28 ENCOUNTER — Ambulatory Visit: Payer: BC Managed Care – PPO | Admitting: *Deleted

## 2012-12-29 ENCOUNTER — Encounter: Payer: BC Managed Care – PPO | Attending: Surgery | Admitting: *Deleted

## 2012-12-29 ENCOUNTER — Encounter: Payer: Self-pay | Admitting: *Deleted

## 2012-12-29 DIAGNOSIS — Z713 Dietary counseling and surveillance: Secondary | ICD-10-CM | POA: Insufficient documentation

## 2012-12-29 DIAGNOSIS — Z09 Encounter for follow-up examination after completed treatment for conditions other than malignant neoplasm: Secondary | ICD-10-CM | POA: Insufficient documentation

## 2012-12-29 DIAGNOSIS — Z9884 Bariatric surgery status: Secondary | ICD-10-CM | POA: Insufficient documentation

## 2012-12-29 NOTE — Progress Notes (Addendum)
Follow-up visit:  9+ Years  Post-Operative Gastric Bypass Surgery  Medical Nutrition Therapy:  Appt start time:  800   end time:  830.  Primary concerns today: Post-operative bariatric surgery nutrition management. Nichole Frank reports increased stress continues. Recently completed multiple interviews for a new job. States she has been skipping meals (usually dinner) recently because it is "easier to snack", though usually has protein shake before bed. Reports drinking only protein shakes over July 4th weekend. Has been more mindful of emotional eating at work and has decreased.  Snacks at night on chips d/t stress.   Surgery date: 03/2004  Surgery type: RYGB Pre surgery weight: 351 lbs  Lowest weight s/p surgery: 225 lbs (maintained 2-4 years)  Weight today: 325.5 lbs Weight change: 1.5 lbs (LOSS) Total weight lost: 25.5 lbs  BMI: 50.2 kg/m^2  Weight goal: 180-225 lbs  % goal met: ~18%  TANITA  BODY COMP RESULTS  09/24/11 11/24/11 03/02/12 07/07/12 08/11/12 10/05/12 11/16/12 12/29/12   %Fat 50.6% 46.5% 50.1% 50.1% 48.7% 47.6% 48.4% 49.2%   Fat Mass (lbs) 163.0 148.0 161.5 163.5 159.5 156.5 158.0 160.0   Fat Free Mass (lbs) 159.0 170.5 161.0 162.5 168.5 172.5 168.0 15.5   Total Body Water (lbs) 116.5 125.0 118.0 119.0 123.5 126.5 123.0 121.0   24-hr recall:  [employer] one time a week; Increased ProJoe (shakes) to 1-2/day.  Has decreased eating at night and consumes mainly protein.  Has stopped bringing "junk" into the house. Has added 20 oz of water daily. Still using MFP, which shows 2-3 days a week of excessive fat intake.   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   Nutritional Diagnosis:  NI-5.8.2 Excessive carbohydrate intake related to increased intake of concentrated sweets, breads, and fast foods as evidenced by pt consuming >100% estimated carb needs s/p Gastric Bypass Surgery.    Intervention:  Nutrition education/reinforcement.  Monitoring/Evaluation:  Dietary intake, exercise, and body weight. Follow up in 6 weeks for post-op visit.

## 2012-12-29 NOTE — Patient Instructions (Addendum)
   Continue previous goals.  Discontinue meal skipping.  Watch food choices and limit carbs to 15 grams per meal/15 grams per snack.  Watch fat intake - aim for 35-40g or less per day.   Discontinue drinking with meals.   Continue current exercise regimen.

## 2013-01-17 ENCOUNTER — Encounter: Payer: Self-pay | Admitting: *Deleted

## 2013-01-17 NOTE — Progress Notes (Signed)
Drop-in for Tanita scan.   Surgery date: 03/2004  Surgery type: RYGB Pre surgery weight: 351 lbs  Lowest weight s/p surgery: 225 lbs (maintained 2-4 years)  Weight today: 320.0 lbs Weight change: 5.5 lbs (LOSS) Total weight lost: 31.0 lbs  BMI: 49.4 kg/m^2  Weight goal: 180-225 lbs   TANITA  BODY COMP RESULTS  09/24/11 11/24/11 03/02/12 07/07/12 08/11/12 10/05/12 11/16/12 12/29/12 01/17/13   %Fat 50.6% 46.5% 50.1% 50.1% 48.7% 47.6% 48.4% 49.2% 50.1%   Fat Mass (lbs) 163.0 148.0 161.5 163.5 159.5 156.5 158.0 160.0 160.5   Fat Free Mass (lbs) 159.0 170.5 161.0 162.5 168.5 172.5 168.0 165.5 159.5   Total Body Water (lbs) 116.5 125.0 118.0 119.0 123.5 126.5 123.0 121.0 117.0

## 2013-01-26 ENCOUNTER — Ambulatory Visit: Payer: BC Managed Care – PPO | Admitting: *Deleted

## 2013-04-20 ENCOUNTER — Other Ambulatory Visit: Payer: Self-pay

## 2013-10-04 ENCOUNTER — Other Ambulatory Visit: Payer: Self-pay | Admitting: Orthopedic Surgery

## 2013-10-04 DIAGNOSIS — M25551 Pain in right hip: Secondary | ICD-10-CM

## 2013-10-20 ENCOUNTER — Other Ambulatory Visit: Payer: BC Managed Care – PPO

## 2014-05-15 ENCOUNTER — Other Ambulatory Visit: Payer: Self-pay | Admitting: Physician Assistant

## 2014-05-15 NOTE — H&P (Signed)
TOTAL HIP ADMISSION H&P  Patient is admitted for right total hip arthroplasty.  Subjective:  Chief Complaint: right hip pain  HPI: Nichole Frank, 63 y.o. female, has a history of pain and functional disability in the right hip(s) due to arthritis and patient has failed non-surgical conservative treatments for greater than 12 weeks to include NSAID's and/or analgesics and use of assistive devices.  Onset of symptoms was abrupt starting 1 years ago with rapidlly worsening course since that time.The patient noted no past surgery on the right hip(s).  Patient currently rates pain in the right hip at 10 out of 10 with activity. Patient has night pain, worsening of pain with activity and weight bearing, trendelenberg gait, pain that interfers with activities of daily living, pain with passive range of motion and joint swelling. Patient has evidence of subchondral sclerosis and joint space narrowing by imaging studies. This condition presents safety issues increasing the risk of falls. This patient has had avascular necrosis of the hip, acetabular fracture, hip dysplasia.  There is no current active infection.  There are no active problems to display for this patient.  Past Medical History  Diagnosis Date  . Obesity   . IBS (irritable bowel syndrome)     Past Surgical History  Procedure Laterality Date  . Gallbladder surgery      removal  . Tonsillectomy and adenoidectomy    . Joint replacement  2002    bilateral knee replacement  . Rotator cuff repair    . Cholecystectomy      pt had to have repair of intestines done after this surgery before   . Carpal tunnel release      right hand  . Gastric bypass  03/2004     (Not in a hospital admission) Allergies  Allergen Reactions  . Adhesive [Tape]     Glue on Steri Strips  . Grapefruit Diet Or [Extra Strength Grapefruit]     Note: PATIENT IS ALLERGIC TO GRAPEFRUIT (FOOD)  . Molds & Smuts     Mold    History  Substance Use Topics   . Smoking status: Former Research scientist (life sciences)  . Smokeless tobacco: Not on file  . Alcohol Use: Yes    Family History  Problem Relation Age of Onset  . Cancer Mother     lymphoma  . Heart disease Father     heart failure     Review of Systems  Constitutional: Negative.   HENT: Negative.   Eyes: Negative.   Respiratory: Negative.   Cardiovascular: Negative.   Gastrointestinal: Positive for diarrhea and constipation. Negative for blood in stool.  Genitourinary: Negative.   Musculoskeletal: Positive for joint pain.  Skin: Negative.   Neurological: Negative.   Endo/Heme/Allergies: Bruises/bleeds easily.  Psychiatric/Behavioral: Negative.     Objective:  Physical Exam  Constitutional: She is oriented to person, place, and time. She appears well-developed and well-nourished.  HENT:  Head: Normocephalic and atraumatic.  Eyes: EOM are normal. Pupils are equal, round, and reactive to light.  Neck: Normal range of motion. Neck supple.  Cardiovascular: Normal rate and regular rhythm.  Exam reveals no gallop and no friction rub.   No murmur heard. Respiratory: Effort normal and breath sounds normal. No respiratory distress. She has no wheezes. She has no rales.  GI: Soft. Bowel sounds are normal.  Musculoskeletal:  Specifically, significant obesity.  Unfortunately a lot of this is around her hip.  When I have her lie supine this does tend to fall away from an  anterior approach for hip replacement.  Markedly positive log roll, right hip.  Both knees continue to have good alignment, good stability and motion 0-115 degrees.    Neurological: She is alert and oriented to person, place, and time.  Skin: Skin is warm and dry.  Psychiatric: She has a normal mood and affect. Her behavior is normal. Judgment and thought content normal.    Vital signs in last 24 hours: @VSRANGES @  Labs:   Estimated body mass index is 49.35 kg/(m^2) as calculated from the following:   Height as of 01/17/13: 5' 7.5"  (1.715 m).   Weight as of 01/17/13: 145.151 kg (320 lb).   Imaging Review Plain radiographs demonstrate severe degenerative joint disease of the right hip(s). The bone quality appears to be fair for age and reported activity level.  Assessment/Plan:  End stage arthritis, right hip(s)  The patient history, physical examination, clinical judgement of the provider and imaging studies are consistent with end stage degenerative joint disease of the right hip(s) and total hip arthroplasty is deemed medically necessary. The treatment options including medical management, injection therapy, arthroscopy and arthroplasty were discussed at length. The risks and benefits of total hip arthroplasty were presented and reviewed. The risks due to aseptic loosening, infection, stiffness, dislocation/subluxation,  thromboembolic complications and other imponderables were discussed.  The patient acknowledged the explanation, agreed to proceed with the plan and consent was signed. Patient is being admitted for inpatient treatment for surgery, pain control, PT, OT, prophylactic antibiotics, VTE prophylaxis, progressive ambulation and ADL's and discharge planning.The patient is planning to be discharged to skilled nursing facility

## 2014-05-21 ENCOUNTER — Encounter (HOSPITAL_COMMUNITY)
Admission: RE | Admit: 2014-05-21 | Discharge: 2014-05-21 | Disposition: A | Discharge status: Home / Self Care | Payer: BC Managed Care – PPO | Source: Ambulatory Visit | Attending: Orthopedic Surgery | Admitting: Orthopedic Surgery

## 2014-05-21 ENCOUNTER — Encounter (HOSPITAL_COMMUNITY): Payer: Self-pay

## 2014-05-21 DIAGNOSIS — Z01818 Encounter for other preprocedural examination: Secondary | ICD-10-CM | POA: Diagnosis not present

## 2014-05-21 DIAGNOSIS — M87851 Other osteonecrosis, right femur: Secondary | ICD-10-CM | POA: Diagnosis not present

## 2014-05-21 HISTORY — DX: Major depressive disorder, single episode, unspecified: F32.9

## 2014-05-21 HISTORY — DX: Essential (primary) hypertension: I10

## 2014-05-21 HISTORY — DX: Depression, unspecified: F32.A

## 2014-05-21 HISTORY — DX: Anxiety disorder, unspecified: F41.9

## 2014-05-21 LAB — CBC WITH DIFFERENTIAL/PLATELET
BASOS ABS: 0.1 10*3/uL (ref 0.0–0.1)
BASOS PCT: 1 % (ref 0–1)
EOS ABS: 0.1 10*3/uL (ref 0.0–0.7)
Eosinophils Relative: 1 % (ref 0–5)
HCT: 39.2 % (ref 36.0–46.0)
Hemoglobin: 12.8 g/dL (ref 12.0–15.0)
Lymphocytes Relative: 24 % (ref 12–46)
Lymphs Abs: 1.9 10*3/uL (ref 0.7–4.0)
MCH: 29.6 pg (ref 26.0–34.0)
MCHC: 32.7 g/dL (ref 30.0–36.0)
MCV: 90.7 fL (ref 78.0–100.0)
Monocytes Absolute: 0.7 10*3/uL (ref 0.1–1.0)
Monocytes Relative: 9 % (ref 3–12)
NEUTROS PCT: 65 % (ref 43–77)
Neutro Abs: 5.1 10*3/uL (ref 1.7–7.7)
PLATELETS: 298 10*3/uL (ref 150–400)
RBC: 4.32 MIL/uL (ref 3.87–5.11)
RDW: 13.8 % (ref 11.5–15.5)
WBC: 7.9 10*3/uL (ref 4.0–10.5)

## 2014-05-21 LAB — APTT: aPTT: 30 seconds (ref 24–37)

## 2014-05-21 LAB — COMPREHENSIVE METABOLIC PANEL
ALBUMIN: 3.5 g/dL (ref 3.5–5.2)
ALT: 13 U/L (ref 0–35)
ANION GAP: 13 (ref 5–15)
AST: 16 U/L (ref 0–37)
Alkaline Phosphatase: 84 U/L (ref 39–117)
BILIRUBIN TOTAL: 0.3 mg/dL (ref 0.3–1.2)
BUN: 17 mg/dL (ref 6–23)
CO2: 27 mEq/L (ref 19–32)
Calcium: 8.8 mg/dL (ref 8.4–10.5)
Chloride: 101 mEq/L (ref 96–112)
Creatinine, Ser: 0.96 mg/dL (ref 0.50–1.10)
GFR calc Af Amer: 72 mL/min — ABNORMAL LOW (ref >90)
GFR calc non Af Amer: 62 mL/min — ABNORMAL LOW (ref >90)
Glucose, Bld: 105 mg/dL — ABNORMAL HIGH (ref 70–99)
Potassium: 3.5 mEq/L — ABNORMAL LOW (ref 3.7–5.3)
SODIUM: 141 meq/L (ref 137–147)
TOTAL PROTEIN: 6.6 g/dL (ref 6.0–8.3)

## 2014-05-21 LAB — URINALYSIS, ROUTINE W REFLEX MICROSCOPIC
Bilirubin Urine: NEGATIVE
GLUCOSE, UA: NEGATIVE mg/dL
Hgb urine dipstick: NEGATIVE
Ketones, ur: NEGATIVE mg/dL
LEUKOCYTES UA: NEGATIVE
Nitrite: NEGATIVE
PROTEIN: NEGATIVE mg/dL
Specific Gravity, Urine: 1.018 (ref 1.005–1.030)
UROBILINOGEN UA: 0.2 mg/dL (ref 0.0–1.0)
pH: 5 (ref 5.0–8.0)

## 2014-05-21 LAB — SURGICAL PCR SCREEN
MRSA, PCR: NEGATIVE
Staphylococcus aureus: NEGATIVE

## 2014-05-21 LAB — PROTIME-INR
INR: 1.05 (ref 0.00–1.49)
Prothrombin Time: 13.8 seconds (ref 11.6–15.2)

## 2014-05-21 NOTE — Pre-Procedure Instructions (Signed)
Nichole Frank  05/21/2014   Your procedure is scheduled on:  05/30/14  Report to [facility] Admitting at 630 AM.  Call this number if you have problems the morning of surgery: [phone]   Remember:   Do not eat food or drink liquids after midnight.   Take these medicines the morning of surgery with A SIP OF WATER: eliquis,celexa,hydrocodone   Do not wear jewelry, make-up or nail polish.  Do not wear lotions, powders, or perfumes. You may wear deodorant.  Do not shave 48 hours prior to surgery. Men may shave face and neck.  Do not bring valuables to the hospital.  [facility] is not responsible                  for any belongings or valuables.               Contacts, dentures or bridgework may not be worn into surgery.  Leave suitcase in the car. After surgery it may be brought to your room.  For patients admitted to the hospital, discharge time is determined by your                treatment team.               Patients discharged the day of surgery will not be allowed to drive  home.  Name and phone number of your driver:   Special Instructions: Incentive Spirometry - Practice and bring it with you on the day of surgery.   Please read over the following fact sheets that you were given: Pain Booklet, Coughing and Deep Breathing, Blood Transfusion Information, MRSA Information and Surgical Site Infection Prevention

## 2014-05-22 LAB — ABO/RH: ABO/RH(D): O POS

## 2014-05-29 MED ORDER — LACTATED RINGERS IV SOLN: INTRAVENOUS | Status: DC

## 2014-05-29 MED ORDER — DEXTROSE 5 % IV SOLN
3.0000 g | INTRAVENOUS | Status: AC
  Administered 2014-05-30: 3 g via INTRAVENOUS
  Filled 2014-05-29: qty 3000

## 2014-05-30 ENCOUNTER — Inpatient Hospital Stay (HOSPITAL_COMMUNITY)
Admission: RE | Admit: 2014-05-30 | Discharge: 2014-06-02 | DRG: 470 | Disposition: A | Discharge status: Home Health | Payer: BC Managed Care – PPO | Source: Ambulatory Visit | Attending: Orthopedic Surgery | Admitting: Orthopedic Surgery

## 2014-05-30 ENCOUNTER — Encounter (HOSPITAL_COMMUNITY)
Admission: RE | Disposition: A | Discharge status: Home Health | Payer: Self-pay | Source: Ambulatory Visit | Attending: Orthopedic Surgery

## 2014-05-30 ENCOUNTER — Inpatient Hospital Stay (HOSPITAL_COMMUNITY): Payer: BC Managed Care – PPO

## 2014-05-30 ENCOUNTER — Encounter (HOSPITAL_COMMUNITY): Payer: Self-pay | Admitting: *Deleted

## 2014-05-30 ENCOUNTER — Inpatient Hospital Stay (HOSPITAL_COMMUNITY): Payer: BC Managed Care – PPO | Admitting: Certified Registered Nurse Anesthetist

## 2014-05-30 DIAGNOSIS — Z87891 Personal history of nicotine dependence: Secondary | ICD-10-CM

## 2014-05-30 DIAGNOSIS — F329 Major depressive disorder, single episode, unspecified: Secondary | ICD-10-CM | POA: Diagnosis present

## 2014-05-30 DIAGNOSIS — Z6841 Body Mass Index (BMI) 40.0 and over, adult: Secondary | ICD-10-CM | POA: Diagnosis not present

## 2014-05-30 DIAGNOSIS — Z807 Family history of other malignant neoplasms of lymphoid, hematopoietic and related tissues: Secondary | ICD-10-CM

## 2014-05-30 DIAGNOSIS — Z8249 Family history of ischemic heart disease and other diseases of the circulatory system: Secondary | ICD-10-CM | POA: Diagnosis not present

## 2014-05-30 DIAGNOSIS — I10 Essential (primary) hypertension: Secondary | ICD-10-CM | POA: Diagnosis present

## 2014-05-30 DIAGNOSIS — F419 Anxiety disorder, unspecified: Secondary | ICD-10-CM | POA: Diagnosis present

## 2014-05-30 DIAGNOSIS — Z7901 Long term (current) use of anticoagulants: Secondary | ICD-10-CM

## 2014-05-30 DIAGNOSIS — M25551 Pain in right hip: Secondary | ICD-10-CM | POA: Diagnosis present

## 2014-05-30 DIAGNOSIS — Z86718 Personal history of other venous thrombosis and embolism: Secondary | ICD-10-CM | POA: Diagnosis not present

## 2014-05-30 DIAGNOSIS — D62 Acute posthemorrhagic anemia: Secondary | ICD-10-CM | POA: Diagnosis not present

## 2014-05-30 DIAGNOSIS — R339 Retention of urine, unspecified: Secondary | ICD-10-CM | POA: Diagnosis not present

## 2014-05-30 DIAGNOSIS — Z96649 Presence of unspecified artificial hip joint: Secondary | ICD-10-CM

## 2014-05-30 DIAGNOSIS — M1611 Unilateral primary osteoarthritis, right hip: Secondary | ICD-10-CM | POA: Diagnosis present

## 2014-05-30 DIAGNOSIS — Z96653 Presence of artificial knee joint, bilateral: Secondary | ICD-10-CM | POA: Diagnosis present

## 2014-05-30 DIAGNOSIS — Z79899 Other long term (current) drug therapy: Secondary | ICD-10-CM

## 2014-05-30 DIAGNOSIS — M1610 Unilateral primary osteoarthritis, unspecified hip: Secondary | ICD-10-CM | POA: Diagnosis present

## 2014-05-30 DIAGNOSIS — Z96641 Presence of right artificial hip joint: Secondary | ICD-10-CM

## 2014-05-30 DIAGNOSIS — K589 Irritable bowel syndrome without diarrhea: Secondary | ICD-10-CM | POA: Diagnosis present

## 2014-05-30 HISTORY — DX: Unspecified osteoarthritis, unspecified site: M19.90

## 2014-05-30 HISTORY — DX: Migraine, unspecified, not intractable, without status migrainosus: G43.909

## 2014-05-30 HISTORY — DX: Anemia, unspecified: D64.9

## 2014-05-30 HISTORY — DX: Acute embolism and thrombosis of unspecified deep veins of unspecified lower extremity: I82.409

## 2014-05-30 HISTORY — DX: Calculus of kidney: N20.0

## 2014-05-30 HISTORY — DX: Other complications of anesthesia, initial encounter: T88.59XA

## 2014-05-30 HISTORY — PX: TOTAL HIP ARTHROPLASTY: SHX124

## 2014-05-30 HISTORY — DX: Personal history of other medical treatment: Z92.89

## 2014-05-30 HISTORY — DX: Pneumonia, unspecified organism: J18.9

## 2014-05-30 HISTORY — DX: Other asthma: J45.998

## 2014-05-30 HISTORY — DX: Nausea with vomiting, unspecified: R11.2

## 2014-05-30 HISTORY — DX: Other specified postprocedural states: Z98.890

## 2014-05-30 HISTORY — DX: Adverse effect of unspecified anesthetic, initial encounter: T41.45XA

## 2014-05-30 SURGERY — ARTHROPLASTY, HIP, TOTAL, ANTERIOR APPROACH
Anesthesia: General | Laterality: Right

## 2014-05-30 MED ORDER — PROPOFOL 10 MG/ML IV BOLUS
INTRAVENOUS | Status: DC | PRN
  Administered 2014-05-30: 150 mg via INTRAVENOUS
  Administered 2014-05-30: 50 mg via INTRAVENOUS

## 2014-05-30 MED ORDER — ONDANSETRON HCL 4 MG/2ML IJ SOLN
INTRAMUSCULAR | Status: DC | PRN
  Administered 2014-05-30: 4 mg via INTRAVENOUS

## 2014-05-30 MED ORDER — NEOSTIGMINE METHYLSULFATE 10 MG/10ML IV SOLN
INTRAVENOUS | Status: DC | PRN
  Administered 2014-05-30: 4 mg via INTRAVENOUS

## 2014-05-30 MED ORDER — SUCCINYLCHOLINE CHLORIDE 20 MG/ML IJ SOLN
INTRAMUSCULAR | Status: AC
  Filled 2014-05-30: qty 1

## 2014-05-30 MED ORDER — CEFAZOLIN SODIUM-DEXTROSE 2-3 GM-% IV SOLR
2.0000 g | Freq: Four times a day (QID) | INTRAVENOUS | Status: AC
  Administered 2014-05-30 (×2): 2 g via INTRAVENOUS
  Filled 2014-05-30 (×2): qty 50

## 2014-05-30 MED ORDER — EPHEDRINE SULFATE 50 MG/ML IJ SOLN
INTRAMUSCULAR | Status: AC
  Filled 2014-05-30: qty 1

## 2014-05-30 MED ORDER — STERILE WATER FOR INJECTION IJ SOLN
INTRAMUSCULAR | Status: AC
  Filled 2014-05-30: qty 10

## 2014-05-30 MED ORDER — SCOPOLAMINE 1 MG/3DAYS TD PT72
1.0000 | MEDICATED_PATCH | TRANSDERMAL | Status: DC
  Administered 2014-05-30: 1 via TRANSDERMAL
  Filled 2014-05-30: qty 1

## 2014-05-30 MED ORDER — ACETAMINOPHEN 650 MG RE SUPP: 650.0000 mg | Freq: Four times a day (QID) | RECTAL | Status: DC | PRN

## 2014-05-30 MED ORDER — OXYCODONE HCL 5 MG PO TABS
5.0000 mg | ORAL_TABLET | ORAL | Status: DC | PRN
  Administered 2014-05-30 – 2014-06-02 (×9): 10 mg via ORAL
  Filled 2014-05-30 (×8): qty 2

## 2014-05-30 MED ORDER — HYDROMORPHONE HCL 1 MG/ML IJ SOLN
INTRAMUSCULAR | Status: AC
  Filled 2014-05-30: qty 1

## 2014-05-30 MED ORDER — HYDROMORPHONE HCL 1 MG/ML IJ SOLN
0.5000 mg | INTRAMUSCULAR | Status: DC | PRN
  Administered 2014-05-30: 0.5 mg via INTRAVENOUS

## 2014-05-30 MED ORDER — FENTANYL CITRATE 0.05 MG/ML IJ SOLN
INTRAMUSCULAR | Status: AC
  Filled 2014-05-30: qty 5

## 2014-05-30 MED ORDER — DEXAMETHASONE SODIUM PHOSPHATE 4 MG/ML IJ SOLN
INTRAMUSCULAR | Status: DC | PRN
  Administered 2014-05-30: 8 mg via INTRAVENOUS

## 2014-05-30 MED ORDER — PROPOFOL 10 MG/ML IV BOLUS
INTRAVENOUS | Status: AC
  Filled 2014-05-30: qty 20

## 2014-05-30 MED ORDER — LIDOCAINE HCL (CARDIAC) 20 MG/ML IV SOLN
INTRAVENOUS | Status: AC
  Filled 2014-05-30: qty 5

## 2014-05-30 MED ORDER — NEOSTIGMINE METHYLSULFATE 10 MG/10ML IV SOLN
INTRAVENOUS | Status: AC
  Filled 2014-05-30: qty 2

## 2014-05-30 MED ORDER — HYDROMORPHONE HCL 1 MG/ML IJ SOLN
INTRAMUSCULAR | Status: AC
  Administered 2014-05-30: 0.5 mg via INTRAVENOUS
  Filled 2014-05-30: qty 1

## 2014-05-30 MED ORDER — EPHEDRINE SULFATE 50 MG/ML IJ SOLN
INTRAMUSCULAR | Status: DC | PRN
  Administered 2014-05-30 (×3): 10 mg via INTRAVENOUS
  Administered 2014-05-30: 5 mg via INTRAVENOUS

## 2014-05-30 MED ORDER — CITALOPRAM HYDROBROMIDE 40 MG PO TABS
40.0000 mg | ORAL_TABLET | Freq: Every day | ORAL | Status: DC
  Filled 2014-05-30: qty 1

## 2014-05-30 MED ORDER — METOCLOPRAMIDE HCL 10 MG PO TABS: 5.0000 mg | ORAL_TABLET | Freq: Three times a day (TID) | ORAL | Status: DC | PRN

## 2014-05-30 MED ORDER — OXYCODONE HCL 5 MG PO TABS: 5.0000 mg | ORAL_TABLET | Freq: Once | ORAL | Status: DC | PRN

## 2014-05-30 MED ORDER — GLYCOPYRROLATE 0.2 MG/ML IJ SOLN
INTRAMUSCULAR | Status: AC
  Filled 2014-05-30: qty 4

## 2014-05-30 MED ORDER — ALBUMIN HUMAN 5 % IV SOLN
INTRAVENOUS | Status: DC | PRN
  Administered 2014-05-30 (×3): via INTRAVENOUS

## 2014-05-30 MED ORDER — PROMETHAZINE HCL 25 MG/ML IJ SOLN: 6.2500 mg | INTRAMUSCULAR | Status: DC | PRN

## 2014-05-30 MED ORDER — SODIUM CHLORIDE 0.9 % IJ SOLN
INTRAMUSCULAR | Status: DC | PRN
  Administered 2014-05-30: 40 mL

## 2014-05-30 MED ORDER — CITALOPRAM HYDROBROMIDE 10 MG PO TABS
10.0000 mg | ORAL_TABLET | Freq: Every day | ORAL | Status: DC
  Administered 2014-05-30 – 2014-06-02 (×4): 10 mg via ORAL
  Filled 2014-05-30 (×4): qty 1

## 2014-05-30 MED ORDER — ONDANSETRON HCL 4 MG/2ML IJ SOLN
INTRAMUSCULAR | Status: AC
  Filled 2014-05-30: qty 2

## 2014-05-30 MED ORDER — DOCUSATE SODIUM 100 MG PO CAPS
100.0000 mg | ORAL_CAPSULE | Freq: Two times a day (BID) | ORAL | Status: DC
  Administered 2014-05-30 – 2014-06-02 (×6): 100 mg via ORAL
  Filled 2014-05-30 (×8): qty 1

## 2014-05-30 MED ORDER — FENTANYL CITRATE 0.05 MG/ML IJ SOLN
INTRAMUSCULAR | Status: DC | PRN
  Administered 2014-05-30: 150 ug via INTRAVENOUS
  Administered 2014-05-30 (×4): 50 ug via INTRAVENOUS

## 2014-05-30 MED ORDER — ACETAMINOPHEN 325 MG PO TABS
650.0000 mg | ORAL_TABLET | Freq: Four times a day (QID) | ORAL | Status: DC | PRN
  Administered 2014-05-31: 650 mg via ORAL
  Filled 2014-05-30: qty 2

## 2014-05-30 MED ORDER — MIDAZOLAM HCL 2 MG/2ML IJ SOLN
INTRAMUSCULAR | Status: AC
  Filled 2014-05-30: qty 2

## 2014-05-30 MED ORDER — DIPHENHYDRAMINE HCL 12.5 MG/5ML PO ELIX
12.5000 mg | ORAL_SOLUTION | ORAL | Status: DC | PRN
  Administered 2014-06-01: 25 mg via ORAL
  Filled 2014-05-30: qty 10

## 2014-05-30 MED ORDER — OXYCODONE HCL 5 MG/5ML PO SOLN: 5.0000 mg | Freq: Once | ORAL | Status: DC | PRN

## 2014-05-30 MED ORDER — APIXABAN 2.5 MG PO TABS
2.5000 mg | ORAL_TABLET | Freq: Two times a day (BID) | ORAL | Status: DC
  Administered 2014-05-31 – 2014-06-02 (×5): 2.5 mg via ORAL
  Filled 2014-05-30 (×6): qty 1

## 2014-05-30 MED ORDER — ONDANSETRON HCL 4 MG/2ML IJ SOLN
4.0000 mg | Freq: Four times a day (QID) | INTRAMUSCULAR | Status: DC | PRN
  Administered 2014-05-30: 4 mg via INTRAVENOUS
  Filled 2014-05-30: qty 2

## 2014-05-30 MED ORDER — PHENOL 1.4 % MT LIQD: 1.0000 | OROMUCOSAL | Status: DC | PRN

## 2014-05-30 MED ORDER — FUROSEMIDE 20 MG PO TABS
20.0000 mg | ORAL_TABLET | Freq: Two times a day (BID) | ORAL | Status: DC
  Administered 2014-05-30 – 2014-06-02 (×6): 20 mg via ORAL
  Filled 2014-05-30 (×8): qty 1

## 2014-05-30 MED ORDER — METHOCARBAMOL 1000 MG/10ML IJ SOLN
500.0000 mg | INTRAVENOUS | Status: AC
  Administered 2014-05-30: 500 mg via INTRAVENOUS
  Filled 2014-05-30: qty 5

## 2014-05-30 MED ORDER — METOCLOPRAMIDE HCL 5 MG/ML IJ SOLN: 5.0000 mg | Freq: Three times a day (TID) | INTRAMUSCULAR | Status: DC | PRN

## 2014-05-30 MED ORDER — OXYCODONE HCL 5 MG PO TABS
ORAL_TABLET | ORAL | Status: AC
  Administered 2014-05-30: 10 mg via ORAL
  Filled 2014-05-30: qty 2

## 2014-05-30 MED ORDER — PHENYLEPHRINE HCL 10 MG/ML IJ SOLN
10.0000 mg | INTRAVENOUS | Status: DC | PRN
  Administered 2014-05-30: 20 ug/min via INTRAVENOUS

## 2014-05-30 MED ORDER — CELECOXIB 200 MG PO CAPS
200.0000 mg | ORAL_CAPSULE | Freq: Two times a day (BID) | ORAL | Status: DC
  Administered 2014-05-30 – 2014-06-02 (×7): 200 mg via ORAL
  Filled 2014-05-30 (×9): qty 1

## 2014-05-30 MED ORDER — MIDAZOLAM HCL 5 MG/5ML IJ SOLN
INTRAMUSCULAR | Status: DC | PRN
  Administered 2014-05-30: 2 mg via INTRAVENOUS

## 2014-05-30 MED ORDER — LISINOPRIL 5 MG PO TABS
5.0000 mg | ORAL_TABLET | Freq: Every day | ORAL | Status: DC
  Administered 2014-05-30 – 2014-06-02 (×4): 5 mg via ORAL
  Filled 2014-05-30 (×4): qty 1

## 2014-05-30 MED ORDER — BISACODYL 5 MG PO TBEC: 5.0000 mg | DELAYED_RELEASE_TABLET | Freq: Every day | ORAL | Status: DC | PRN

## 2014-05-30 MED ORDER — BUPIVACAINE LIPOSOME 1.3 % IJ SUSP
20.0000 mL | Freq: Once | INTRAMUSCULAR | Status: AC
  Administered 2014-05-30: 20 mL
  Filled 2014-05-30: qty 20

## 2014-05-30 MED ORDER — DEXTROSE 5 % IV SOLN: 500.0000 mg | Freq: Four times a day (QID) | INTRAVENOUS | Status: DC | PRN

## 2014-05-30 MED ORDER — LIDOCAINE HCL (CARDIAC) 20 MG/ML IV SOLN
INTRAVENOUS | Status: DC | PRN
  Administered 2014-05-30: 100 mg via INTRAVENOUS

## 2014-05-30 MED ORDER — LACTATED RINGERS IV SOLN
INTRAVENOUS | Status: DC | PRN
  Administered 2014-05-30 (×3): via INTRAVENOUS

## 2014-05-30 MED ORDER — METHOCARBAMOL 500 MG PO TABS
500.0000 mg | ORAL_TABLET | Freq: Four times a day (QID) | ORAL | Status: DC | PRN
  Administered 2014-05-31 – 2014-06-02 (×3): 500 mg via ORAL
  Filled 2014-05-30 (×3): qty 1

## 2014-05-30 MED ORDER — 0.9 % SODIUM CHLORIDE (POUR BTL) OPTIME
TOPICAL | Status: DC | PRN
  Administered 2014-05-30: 1000 mL

## 2014-05-30 MED ORDER — HYDROMORPHONE HCL 1 MG/ML IJ SOLN
0.5000 mg | INTRAMUSCULAR | Status: DC | PRN
  Administered 2014-05-31: 1 mg via INTRAVENOUS
  Filled 2014-05-30 (×2): qty 1

## 2014-05-30 MED ORDER — HYDROMORPHONE HCL 1 MG/ML IJ SOLN
0.2500 mg | INTRAMUSCULAR | Status: DC | PRN
  Administered 2014-05-30 (×4): 0.5 mg via INTRAVENOUS

## 2014-05-30 MED ORDER — DEXAMETHASONE SODIUM PHOSPHATE 4 MG/ML IJ SOLN
INTRAMUSCULAR | Status: AC
  Filled 2014-05-30: qty 2

## 2014-05-30 MED ORDER — ROCURONIUM BROMIDE 100 MG/10ML IV SOLN
INTRAVENOUS | Status: DC | PRN
  Administered 2014-05-30: 50 mg via INTRAVENOUS

## 2014-05-30 MED ORDER — NALTREXONE-BUPROPION HCL ER 8-90 MG PO TB12
2.0000 | ORAL_TABLET | Freq: Two times a day (BID) | ORAL | Status: DC
  Administered 2014-06-01: 2 via ORAL

## 2014-05-30 MED ORDER — MENTHOL 3 MG MT LOZG: 1.0000 | LOZENGE | OROMUCOSAL | Status: DC | PRN

## 2014-05-30 MED ORDER — POTASSIUM CHLORIDE IN NACL 20-0.9 MEQ/L-% IV SOLN
INTRAVENOUS | Status: DC
  Administered 2014-05-30: 15:00:00 via INTRAVENOUS
  Filled 2014-05-30 (×3): qty 1000

## 2014-05-30 MED ORDER — GLYCOPYRROLATE 0.2 MG/ML IJ SOLN
INTRAMUSCULAR | Status: DC | PRN
  Administered 2014-05-30: 0.6 mg via INTRAVENOUS

## 2014-05-30 MED ORDER — OXYCODONE-ACETAMINOPHEN 5-325 MG PO TABS
1.0000 | ORAL_TABLET | ORAL | Status: DC | PRN
Start: 2014-05-30 — End: 2021-09-02

## 2014-05-30 MED ORDER — ONDANSETRON HCL 4 MG PO TABS: 4.0000 mg | ORAL_TABLET | Freq: Four times a day (QID) | ORAL | Status: DC | PRN

## 2014-05-30 MED ORDER — ALUM & MAG HYDROXIDE-SIMETH 200-200-20 MG/5ML PO SUSP: 30.0000 mL | ORAL | Status: DC | PRN

## 2014-05-30 SURGICAL SUPPLY — 63 items
BLADE SAW SGTL 18X1.27X75 (BLADE) ×2 IMPLANT
BLADE SAW SGTL 18X1.27X75MM (BLADE) ×1
BLADE SURG ROTATE 9660 (MISCELLANEOUS) IMPLANT
CAPT HIP TOTAL 2 ×2 IMPLANT
CLOSURE STERI-STRIP 1/2X4 (GAUZE/BANDAGES/DRESSINGS) ×1
CLSR STERI-STRIP ANTIMIC 1/2X4 (GAUZE/BANDAGES/DRESSINGS) ×1 IMPLANT
COVER SURGICAL LIGHT HANDLE (MISCELLANEOUS) ×3 IMPLANT
DRAPE IMP U-DRAPE 54X76 (DRAPES) ×9 IMPLANT
DRAPE INCISE IOBAN 66X45 STRL (DRAPES) ×5 IMPLANT
DRAPE ORTHO SPLIT 77X108 STRL (DRAPES) ×6
DRAPE PROXIMA HALF (DRAPES) ×5 IMPLANT
DRAPE SURG 17X23 STRL (DRAPES) ×1 IMPLANT
DRAPE SURG ORHT 6 SPLT 77X108 (DRAPES) ×2 IMPLANT
DRAPE U-SHAPE 47X51 STRL (DRAPES) ×5 IMPLANT
DRSG AQUACEL AG ADV 3.5X10 (GAUZE/BANDAGES/DRESSINGS) ×1 IMPLANT
DRSG AQUACEL AG ADV 3.5X14 (GAUZE/BANDAGES/DRESSINGS) ×2 IMPLANT
DURAPREP 26ML APPLICATOR (WOUND CARE) ×5 IMPLANT
ELECT BLADE 4.0 EZ CLEAN MEGAD (MISCELLANEOUS) ×3
ELECT CAUTERY BLADE 6.4 (BLADE) ×3 IMPLANT
ELECT REM PT RETURN 9FT ADLT (ELECTROSURGICAL) ×3
ELECTRODE BLDE 4.0 EZ CLN MEGD (MISCELLANEOUS) IMPLANT
ELECTRODE REM PT RTRN 9FT ADLT (ELECTROSURGICAL) ×1 IMPLANT
FACESHIELD WRAPAROUND (MASK) ×6 IMPLANT
FACESHIELD WRAPAROUND OR TEAM (MASK) ×2 IMPLANT
GLOVE BIO SURGEON STRL SZ 6 (GLOVE) ×2 IMPLANT
GLOVE BIO SURGEON STRL SZ7.5 (GLOVE) ×2 IMPLANT
GLOVE BIOGEL PI IND STRL 6 (GLOVE) IMPLANT
GLOVE BIOGEL PI IND STRL 7.0 (GLOVE) IMPLANT
GLOVE BIOGEL PI INDICATOR 6 (GLOVE) ×2
GLOVE BIOGEL PI INDICATOR 7.0 (GLOVE)
GLOVE BIOGEL PI ORTHO PRO SZ8 (GLOVE) ×2
GLOVE ECLIPSE 6.5 STRL STRAW (GLOVE) IMPLANT
GLOVE ECLIPSE 7.5 STRL STRAW (GLOVE) ×2 IMPLANT
GLOVE ORTHO TXT STRL SZ7.5 (GLOVE) ×3 IMPLANT
GLOVE PI ORTHO PRO STRL SZ8 (GLOVE) IMPLANT
GOWN STRL REUS W/ TWL LRG LVL3 (GOWN DISPOSABLE) ×3 IMPLANT
GOWN STRL REUS W/ TWL XL LVL3 (GOWN DISPOSABLE) ×1 IMPLANT
GOWN STRL REUS W/TWL 2XL LVL3 (GOWN DISPOSABLE) ×2 IMPLANT
GOWN STRL REUS W/TWL LRG LVL3 (GOWN DISPOSABLE) ×6
GOWN STRL REUS W/TWL XL LVL3 (GOWN DISPOSABLE) ×3
KIT BASIN OR (CUSTOM PROCEDURE TRAY) ×3 IMPLANT
KIT ROOM TURNOVER OR (KITS) ×3 IMPLANT
MANIFOLD NEPTUNE II (INSTRUMENTS) ×3 IMPLANT
NDL SAFETY ECLIPSE 18X1.5 (NEEDLE) ×1 IMPLANT
NEEDLE HYPO 18GX1.5 SHARP (NEEDLE) ×3
NS IRRIG 1000ML POUR BTL (IV SOLUTION) ×3 IMPLANT
PACK TOTAL JOINT (CUSTOM PROCEDURE TRAY) ×3 IMPLANT
PACK UNIVERSAL I (CUSTOM PROCEDURE TRAY) ×3 IMPLANT
PAD ARMBOARD 7.5X6 YLW CONV (MISCELLANEOUS) ×6 IMPLANT
SUCTION FRAZIER TIP 10 FR DISP (SUCTIONS) ×3 IMPLANT
SUT ETHIBOND NAB CT1 #1 30IN (SUTURE) ×6 IMPLANT
SUT MNCRL AB 4-0 PS2 18 (SUTURE) ×3 IMPLANT
SUT VIC AB 0 CT1 27 (SUTURE) ×6
SUT VIC AB 0 CT1 27XBRD ANBCTR (SUTURE) ×1 IMPLANT
SUT VIC AB 1 CT1 27 (SUTURE) ×6
SUT VIC AB 1 CT1 27XBRD ANBCTR (SUTURE) IMPLANT
SUT VIC AB 2-0 CT1 27 (SUTURE) ×12
SUT VIC AB 2-0 CT1 TAPERPNT 27 (SUTURE) ×2 IMPLANT
SYR 50ML LL SCALE MARK (SYRINGE) ×3 IMPLANT
TOWEL OR 17X24 6PK STRL BLUE (TOWEL DISPOSABLE) ×3 IMPLANT
TOWEL OR 17X26 10 PK STRL BLUE (TOWEL DISPOSABLE) ×3 IMPLANT
TRAY FOLEY CATH 14FR (SET/KITS/TRAYS/PACK) IMPLANT
WATER STERILE IRR 1000ML POUR (IV SOLUTION) ×2 IMPLANT

## 2014-05-30 NOTE — Progress Notes (Signed)
Ted hose not placed due

## 2014-05-30 NOTE — Progress Notes (Signed)
Dr. Percell Miller paged to inform him that pt has rash on lower abdomen and right groin area since wiping with CHG. Area cleansed with soap and water when rash began and warm wet washcloths applied to area.

## 2014-05-30 NOTE — Discharge Instructions (Signed)
Total Hip Replacement, Care After Refer to this sheet in the next few weeks. These instructions provide you with information on caring for yourself after your procedure. Your health care provider may also give you specific instructions. Your treatment has been planned according to the most current medical practices, but problems sometimes occur. Call your health care provider if you have any problems or questions after your procedure. HOME CARE INSTRUCTIONS   Weight bearing as tolerated.  Take Eliquis as directed for a total of 12 days following surgery to prevent blood clots.  Change dressing daily starting on Saturday.  May shower on Monday, but do not soak incision.  May apply ice for up to 20 minutes at a time for pain and swelling.  Follow up appointment in two weeks.    Your health care provider will give you specific precautions for certain types of movement. Additional instructions include:  Take medicines only as directed by your health care provider.  Take quick showers (3-5 min) rather than bathe until your health care provider tells you that you can take baths again.  Avoid lifting until your health care provider instructs you otherwise.  Use a raised toilet seat and avoid sitting in low chairs as instructed by your health care provider.  Use crutches or a walker as instructed by your health care provider. SEEK MEDICAL CARE IF:  You have difficulty breathing.  You have drainage, redness, or swelling at your incision site.  You have a bad smell coming from your incision site.  You have persistent bleeding from your incision site.  Your incision breaks open after sutures (stitches) or staples have been removed.  You have a fever. SEEK IMMEDIATE MEDICAL CARE IF:   You have a rash.  You have pain or swelling in your calf or thigh.  You have shortness of breath or chest pain. MAKE SURE YOU:  Understand these instructions.  Will watch your condition.  Will get help  if you are not doing well or get worse. Document Released: 12/19/2004 Document Revised: 10/16/2013 Document Reviewed: 08/02/2013 [facility] Patient Information 2015 [place], [place]. This information is not intended to replace advice given to you by your health care provider. Make sure you discuss any questions you have with your health care provider.  Information on my medicine - ELIQUIS (apixaban)  This medication education was reviewed with me or my healthcare representative as part of my discharge preparation.  The pharmacist that spoke with me during my hospital stay was:  Jaquita Folds, [facility]  Why was Eliquis prescribed for you? Eliquis was prescribed for you to reduce the risk of blood clots forming after orthopedic surgery.    What do You need to know about Eliquis? Take your Eliquis TWICE DAILY - one tablet in the morning and one tablet in the evening with or without food.  It would be best to take the dose about the same time each day.  If you have difficulty swallowing the tablet whole please discuss with your pharmacist how to take the medication safely.  Take Eliquis exactly as prescribed by your doctor and DO NOT stop taking Eliquis without talking to the doctor who prescribed the medication.  Stopping without other medication to take the place of Eliquis may increase your risk of developing a clot.  After discharge, you should have regular check-up appointments with your healthcare provider that is prescribing your Eliquis.  What do you do if you miss a dose? If a dose of ELIQUIS is not  taken at the scheduled time, take it as soon as possible on the same day and twice-daily administration should be resumed.  The dose should not be doubled to make up for a missed dose.  Do not take more than one tablet of ELIQUIS at the same time.  Important Safety Information A possible side effect of Eliquis is bleeding. You should call your healthcare provider right away if you  experience any of the following: ? Bleeding from an injury or your nose that does not stop. ? Unusual colored urine (red or dark brown) or unusual colored stools (red or black). ? Unusual bruising for unknown reasons. ? A serious fall or if you hit your head (even if there is no bleeding).  Some medicines may interact with Eliquis and might increase your risk of bleeding or clotting while on Eliquis. To help avoid this, consult your healthcare provider or pharmacist prior to using any new prescription or non-prescription medications, including herbals, vitamins, non-steroidal anti-inflammatory drugs (NSAIDs) and supplements.  This website has more information on Eliquis (apixaban): http://www.eliquis.com/eliquis/home

## 2014-05-30 NOTE — Interval H&P Note (Signed)
History and Physical Interval Note:  05/30/2014 8:26 AM  Nichole Frank  has presented today for surgery, with the diagnosis of DJD RIGHT HIP  The various methods of treatment have been discussed with the patient and family. After consideration of risks, benefits and other options for treatment, the patient has consented to  Procedure(s): TOTAL HIP ARTHROPLASTY ANTERIOR APPROACH (Right) as a surgical intervention .  The patient's history has been reviewed, patient examined, no change in status, stable for surgery.  I have reviewed the patient's chart and labs.  Questions were answered to the patient's satisfaction.     Zymier Rodgers F

## 2014-05-30 NOTE — Transfer of Care (Signed)
Immediate Anesthesia Transfer of Care Note  Patient: Nichole Frank  Procedure(s) Performed: Procedure(s): TOTAL HIP ARTHROPLASTY ANTERIOR APPROACH (Right)  Patient Location: PACU  Anesthesia Type:General  Level of Consciousness: awake, alert  and oriented  Airway & Oxygen Therapy: Patient Spontanous Breathing and Patient connected to nasal cannula oxygen  Post-op Assessment: Report given to PACU RN, Post -op Vital signs reviewed and stable and Patient moving all extremities X 4  Post vital signs: Reviewed and stable  Complications: No apparent anesthesia complications

## 2014-05-30 NOTE — Progress Notes (Addendum)
Dressing to right hip has increased bleeding since patient up with PT and this nurse. Removed old dressing and applied ABD pads with two icebags, per discussion with Kendell Bane, charge RN. Will continue to monitor.

## 2014-05-30 NOTE — Anesthesia Procedure Notes (Signed)
Procedure Name: Intubation Date/Time: 05/30/2014 8:41 AM Performed by: Garrison Columbus T Pre-anesthesia Checklist: Patient identified, Emergency Drugs available, Suction available and Patient being monitored Patient Re-evaluated:Patient Re-evaluated prior to inductionOxygen Delivery Method: Circle system utilized Preoxygenation: Pre-oxygenation with 100% oxygen Intubation Type: IV induction Ventilation: Mask ventilation without difficulty Laryngoscope Size: Miller and 2 Grade View: Grade I Tube type: Oral Tube size: 7.5 mm Number of attempts: 1 Airway Equipment and Method: Stylet Placement Confirmation: positive ETCO2,  ETT inserted through vocal cords under direct vision and breath sounds checked- equal and bilateral Secured at: 21 cm Tube secured with: Tape Dental Injury: Teeth and Oropharynx as per pre-operative assessment

## 2014-05-30 NOTE — Progress Notes (Signed)
Orthopedic Tech Progress Note Patient Details:  CHANIE SOUCEK Aug 21, 1950 [id number]  Ortho Devices Ortho Device/Splint Location: trapeze bar patient helper Ortho Device/Splint Interventions: Application Viewed order from doctor's order list  Hildred Priest 05/30/2014, 2:21 PM

## 2014-05-30 NOTE — Progress Notes (Signed)
Patient states pain levekl of 7 but does not want pain medication at this time. Requests Zofran for nausea.

## 2014-05-30 NOTE — H&P (View-Only) (Signed)
TOTAL HIP ADMISSION H&P  Patient is admitted for right total hip arthroplasty.  Subjective:  Chief Complaint: right hip pain  HPI: Nichole Frank, 63 y.o. female, has a history of pain and functional disability in the right hip(s) due to arthritis and patient has failed non-surgical conservative treatments for greater than 12 weeks to include NSAID's and/or analgesics and use of assistive devices.  Onset of symptoms was abrupt starting 1 years ago with rapidlly worsening course since that time.The patient noted no past surgery on the right hip(s).  Patient currently rates pain in the right hip at 10 out of 10 with activity. Patient has night pain, worsening of pain with activity and weight bearing, trendelenberg gait, pain that interfers with activities of daily living, pain with passive range of motion and joint swelling. Patient has evidence of subchondral sclerosis and joint space narrowing by imaging studies. This condition presents safety issues increasing the risk of falls. This patient has had avascular necrosis of the hip, acetabular fracture, hip dysplasia.  There is no current active infection.  There are no active problems to display for this patient.  Past Medical History  Diagnosis Date  . Obesity   . IBS (irritable bowel syndrome)     Past Surgical History  Procedure Laterality Date  . Gallbladder surgery      removal  . Tonsillectomy and adenoidectomy    . Joint replacement  2002    bilateral knee replacement  . Rotator cuff repair    . Cholecystectomy      pt had to have repair of intestines done after this surgery before   . Carpal tunnel release      right hand  . Gastric bypass  03/2004     (Not in a hospital admission) Allergies  Allergen Reactions  . Adhesive [Tape]     Glue on Steri Strips  . Grapefruit Diet Or [Extra Strength Grapefruit]     Note: PATIENT IS ALLERGIC TO GRAPEFRUIT (FOOD)  . Molds & Smuts     Mold    History  Substance Use Topics   . Smoking status: Former Research scientist (life sciences)  . Smokeless tobacco: Not on file  . Alcohol Use: Yes    Family History  Problem Relation Age of Onset  . Cancer Mother     lymphoma  . Heart disease Father     heart failure     Review of Systems  Constitutional: Negative.   HENT: Negative.   Eyes: Negative.   Respiratory: Negative.   Cardiovascular: Negative.   Gastrointestinal: Positive for diarrhea and constipation. Negative for blood in stool.  Genitourinary: Negative.   Musculoskeletal: Positive for joint pain.  Skin: Negative.   Neurological: Negative.   Endo/Heme/Allergies: Bruises/bleeds easily.  Psychiatric/Behavioral: Negative.     Objective:  Physical Exam  Constitutional: She is oriented to person, place, and time. She appears well-developed and well-nourished.  HENT:  Head: Normocephalic and atraumatic.  Eyes: EOM are normal. Pupils are equal, round, and reactive to light.  Neck: Normal range of motion. Neck supple.  Cardiovascular: Normal rate and regular rhythm.  Exam reveals no gallop and no friction rub.   No murmur heard. Respiratory: Effort normal and breath sounds normal. No respiratory distress. She has no wheezes. She has no rales.  GI: Soft. Bowel sounds are normal.  Musculoskeletal:  Specifically, significant obesity.  Unfortunately a lot of this is around her hip.  When I have her lie supine this does tend to fall away from an  anterior approach for hip replacement.  Markedly positive log roll, right hip.  Both knees continue to have good alignment, good stability and motion 0-115 degrees.    Neurological: She is alert and oriented to person, place, and time.  Skin: Skin is warm and dry.  Psychiatric: She has a normal mood and affect. Her behavior is normal. Judgment and thought content normal.    Vital signs in last 24 hours: @VSRANGES @  Labs:   Estimated body mass index is 49.35 kg/(m^2) as calculated from the following:   Height as of 01/17/13: 5' 7.5"  (1.715 m).   Weight as of 01/17/13: 145.151 kg (320 lb).   Imaging Review Plain radiographs demonstrate severe degenerative joint disease of the right hip(s). The bone quality appears to be fair for age and reported activity level.  Assessment/Plan:  End stage arthritis, right hip(s)  The patient history, physical examination, clinical judgement of the provider and imaging studies are consistent with end stage degenerative joint disease of the right hip(s) and total hip arthroplasty is deemed medically necessary. The treatment options including medical management, injection therapy, arthroscopy and arthroplasty were discussed at length. The risks and benefits of total hip arthroplasty were presented and reviewed. The risks due to aseptic loosening, infection, stiffness, dislocation/subluxation,  thromboembolic complications and other imponderables were discussed.  The patient acknowledged the explanation, agreed to proceed with the plan and consent was signed. Patient is being admitted for inpatient treatment for surgery, pain control, PT, OT, prophylactic antibiotics, VTE prophylaxis, progressive ambulation and ADL's and discharge planning.The patient is planning to be discharged to skilled nursing facility

## 2014-05-30 NOTE — Interval H&P Note (Signed)
History and Physical Interval Note:  05/30/2014 8:28 AM  Nichole Frank  has presented today for surgery, with the diagnosis of DJD RIGHT HIP  The various methods of treatment have been discussed with the patient and family. After consideration of risks, benefits and other options for treatment, the patient has consented to  Procedure(s): TOTAL HIP ARTHROPLASTY ANTERIOR APPROACH (Right) as a surgical intervention .  The patient's history has been reviewed, patient examined, no change in status, stable for surgery.  I have reviewed the patient's chart and labs.  Questions were answered to the patient's satisfaction.     Angelyna Henderson F

## 2014-05-30 NOTE — Progress Notes (Signed)
Utilization review completed.  

## 2014-05-30 NOTE — Anesthesia Preprocedure Evaluation (Addendum)
Anesthesia Evaluation  Patient identified by MRN, date of birth, ID band Patient awake    Reviewed: Allergy & Precautions, H&P , NPO status , Patient's Chart, lab work & pertinent test results  Airway Mallampati: II  TM Distance: >3 FB Neck ROM: Full    Dental  (+) Dental Advisory Given, Teeth Intact, Caps   Pulmonary former smoker,    Pulmonary exam normal       Cardiovascular hypertension, Pt. on medications     Neuro/Psych PSYCHIATRIC DISORDERS Anxiety Depression    GI/Hepatic   Endo/Other  Morbid obesity  Renal/GU      Musculoskeletal  (+) Arthritis -,   Abdominal   Peds  Hematology   Anesthesia Other Findings   Reproductive/Obstetrics                            Anesthesia Physical Anesthesia Plan  ASA: III  Anesthesia Plan: General   Post-op Pain Management:    Induction: Intravenous  Airway Management Planned: Oral ETT  Additional Equipment:   Intra-op Plan:   Post-operative Plan: Extubation in OR  Informed Consent: I have reviewed the patients History and Physical, chart, labs and discussed the procedure including the risks, benefits and alternatives for the proposed anesthesia with the patient or authorized representative who has indicated his/her understanding and acceptance.   Dental advisory given  Plan Discussed with: CRNA, Anesthesiologist and Surgeon  Anesthesia Plan Comments:        Anesthesia Quick Evaluation

## 2014-05-30 NOTE — Progress Notes (Signed)
Do not have ted hose large enough for patient.

## 2014-05-30 NOTE — OR Nursing (Signed)
Nichole Frank arrived from OR on a bed. Attached to monitors, oxygen, 6/10 R Hip pain.  Fentanyl given by CRNA.  RN reported exparel given at 1015 and unable to find TED hose that fit properly.

## 2014-05-30 NOTE — Anesthesia Postprocedure Evaluation (Signed)
Anesthesia Post Note  Patient: Nichole Frank  Procedure(s) Performed: Procedure(s) (LRB): TOTAL HIP ARTHROPLASTY ANTERIOR APPROACH (Right)  Anesthesia type: general  Patient location: PACU  Post pain: Pain level controlled  Post assessment: Patient's Cardiovascular Status Stable  Last Vitals:  Filed Vitals:   05/30/14 1145  BP: 141/70  Pulse: 64  Temp:   Resp: 23    Post vital signs: Reviewed and stable  Level of consciousness: sedated  Complications: No apparent anesthesia complications

## 2014-05-30 NOTE — Discharge Summary (Addendum)
Patient ID: Nichole Frank MRN: [id number] DOB/AGE: 06/21/1950 63 y.o.  Admit date: 05/30/2014 Discharge date: 06/01/2014  Admission Diagnoses:  Active Problems:   Primary localized osteoarthrosis of pelvic region   Discharge Diagnoses:  Same  Past Medical History  Diagnosis Date  . Obesity   . IBS (irritable bowel syndrome)   . Hypertension   . Depression   . Anxiety   . Kidney stones     "passed on their own"  . Complication of anesthesia     "I got cold"  . PONV (postoperative nausea and vomiting)   . DVT (deep venous thrombosis) 1993    RLE  . Pneumonia 2012  . Seasonal asthma     "spring and fall"  . Anemia   . History of blood transfusion 2002    "related to knee replacements"  . Migraine     "when I get close to linseed oil products"  . Arthritis     "all over"    Surgeries: Procedure(s): TOTAL HIP ARTHROPLASTY ANTERIOR APPROACH on 05/30/2014   Consultants:    Discharged Condition: Improved  Hospital Course: Nichole Frank is an 63 y.o. female who was admitted 05/30/2014 for operative treatment of right hip primary osteoarthritis. Patient has severe unremitting pain that affects sleep, daily activities, and work/hobbies. After pre-op clearance the patient was taken to the operating room on 05/30/2014 and underwent  Procedure(s): TOTAL HIP ARTHROPLASTY ANTERIOR APPROACH.  Patient with pre-op hb of 12.8 developed ABLA on pod #1 with a hb of 7.2.  She is symptomatic and we will proceed with transfusion of 2 units PRBC.  Patient feels much better and is asymptomatic following transfusion on POD#1.  She also developed acute urinary retention following surgery. This has resolved, however her kidney function has decreased.  Will possibly consult medicine regarding this issue.   Patient was given perioperative antibiotics:      Anti-infectives    Start     Dose/Rate Route Frequency Ordered Stop   05/30/14 1430  ceFAZolin (ANCEF) IVPB 2 g/50 mL premix     2 g100 mL/hr over 30 Minutes Intravenous Every 6 hours 05/30/14 1357 05/30/14 2148   05/30/14 0600  ceFAZolin (ANCEF) 3 g in dextrose 5 % 50 mL IVPB     3 g160 mL/hr over 30 Minutes Intravenous On call to O.R. 05/29/14 1357 05/30/14 0854       Patient was given sequential compression devices, early ambulation, and chemoprophylaxis to prevent DVT.  Patient benefited maximally from hospital stay and there were no complications.    Recent vital signs:  Patient Vitals for the past 24 hrs:  BP Temp Temp src Pulse Resp SpO2  06/01/14 0545 (!) 110/46 mmHg 98.7 F (37.1 C) Oral 63 17 95 %  06/01/14 0400 - - - - 18 100 %  06/01/14 0000 - - - - 18 100 %  05/31/14 2137 (!) 91/42 mmHg 98.9 F (37.2 C) Oral 74 17 100 %  05/31/14 2000 - - - - 18 100 %  05/31/14 1745 (!) 107/44 mmHg 98 F (36.7 C) Oral 72 16 95 %  05/31/14 1540 (!) 89/39 mmHg 98.3 F (36.8 C) Oral 72 15 92 %  05/31/14 1330 (!) 110/52 mmHg 98.3 F (36.8 C) Oral 62 16 96 %  05/31/14 1145 (!) 98/41 mmHg 98.5 F (36.9 C) Oral (!) 55 16 95 %  05/31/14 1130 (!) 103/43 mmHg 99.1 F (37.3 C) Axillary (!) 57 16 97 %  Recent laboratory studies:   Recent Labs  05/31/14 0545 05/31/14 1938 06/01/14 0504  WBC 9.7 11.9* 8.9  HGB 7.2* 9.1* 8.3*  HCT 22.4* 27.9* 25.4*  PLT 216 232 180  NA 138  --  135*  K 4.3  --  4.1  CL 104  --  102  CO2 25  --  24  BUN 12  --  20  CREATININE 0.79  --  1.31*  GLUCOSE 121*  --  127*  CALCIUM 8.1*  --  8.2*     Discharge Medications:     Medication List    STOP taking these medications        HYDROcodone-acetaminophen 5-325 MG per tablet  Commonly known as:  NORCO/VICODIN     nabumetone 750 MG tablet  Commonly known as:  RELAFEN     traMADol 50 MG tablet  Commonly known as:  ULTRAM      TAKE these medications        CALCIUM CITRATE + PO  Take 1,000 mg by mouth daily.     citalopram 10 MG tablet  Commonly known as:  CELEXA  Take 10 mg by mouth daily.     ELIQUIS 2.5  MG Tabs tablet  Generic drug:  apixaban  Take 2.5 mg by mouth 2 (two) times daily.     furosemide 40 MG tablet  Commonly known as:  LASIX  Take 20 mg by mouth 2 (two) times daily.     lisinopril 5 MG tablet  Commonly known as:  PRINIVIL,ZESTRIL  Take 5 mg by mouth daily.     Naltrexone-Bupropion HCl ER 8-90 MG Tb12  Take 2 tablets by mouth 2 (two) times daily.     ondansetron 4 MG tablet  Commonly known as:  ZOFRAN  Take 4 mg by mouth every 8 (eight) hours as needed for nausea or vomiting.     OVER THE COUNTER MEDICATION  Take 1 tablet by mouth at bedtime. SUPPLEMENT: Iron Plus Vitamin C     oxyCODONE-acetaminophen 5-325 MG per tablet  Commonly known as:  ROXICET  Take 1-2 tablets by mouth every 4 (four) hours as needed.     tiZANidine 4 MG capsule  Commonly known as:  ZANAFLEX  Take 4 mg by mouth at bedtime as needed for muscle spasms.     VITAMIN B 12 PO  Take 1 each by mouth every Sunday. Liquid vitamin B12     VITAMIN D PO  Take 1 capsule by mouth daily.        Diagnostic Studies: Dg Hip Portable 1 View Right  05/30/2014   CLINICAL DATA:  Postop radiograph, status post right hip arthroplasty.  EXAM: PORTABLE RIGHT HIP - 1 VIEW  COMPARISON:  Hip MRI, 10/11/2013  FINDINGS: Single AP image shows a right hip prosthesis with the femoral and acetabular components well seated and aligned. No acute fracture or evidence of an operative complication.  IMPRESSION: Well-aligned right hip prosthesis   Electronically Signed   By: Lajean Manes M.D.   On: 05/30/2014 11:42    Disposition:   Discharge Instructions    Call MD / Call 911    Complete by:  As directed   If you experience chest pain or shortness of breath, CALL 911 and be transported to the hospital emergency room.  If you develope a fever above 101 F, pus (white drainage) or increased drainage or redness at the wound, or calf pain, call your surgeon's office.  Change dressing    Complete by:  As directed   Change  the dressing daily with sterile 4 x 4 inch gauze dressing and paper tape.  You may clean the incision with alcohol prior to redressing     Constipation Prevention    Complete by:  As directed   Drink plenty of fluids.  Prune juice may be helpful.  You may use a stool softener, such as Colace (over the counter) 100 mg twice a day.  Use MiraLax (over the counter) for constipation as needed.     Diet - low sodium heart healthy    Complete by:  As directed      Discharge instructions    Complete by:  As directed   Weight bearing as tolerated.  Take Eliquis as directed for a total of 12 days after surgery to prevent blood clots.  Change dressing daily starting on Saturday.  May shower on Monday, but do not soak incision.  May apply ice for up to 20 minutes at a time for pain and swelling.  Follow up appointment in two weeks.     Follow the hip precautions as taught in Physical Therapy    Complete by:  As directed   Posterior total hip precautions.  Weight bearing as tolerated     Increase activity slowly as tolerated    Complete by:  As directed      TED hose    Complete by:  As directed   Use stockings (TED hose) for 2-3 weeks on both leg(s).  You may remove them at night for sleeping.           Follow-up Information    Follow up with [facility], MD. Schedule an appointment as soon as possible for a visit in 2 weeks.   Specialty:  Orthopedic Surgery   Contact information:   [address] [place] [address] [phone]        Signed: Larae Grooms 06/01/2014, 8:24 AM

## 2014-05-31 ENCOUNTER — Encounter (HOSPITAL_COMMUNITY): Payer: Self-pay | Admitting: General Practice

## 2014-05-31 LAB — CBC
HCT: 22.4 % — ABNORMAL LOW (ref 36.0–46.0)
HCT: 27.9 % — ABNORMAL LOW (ref 36.0–46.0)
HEMOGLOBIN: 9.1 g/dL — AB (ref 12.0–15.0)
Hemoglobin: 7.2 g/dL — ABNORMAL LOW (ref 12.0–15.0)
MCH: 29.4 pg (ref 26.0–34.0)
MCH: 29.7 pg (ref 26.0–34.0)
MCHC: 32.1 g/dL (ref 30.0–36.0)
MCHC: 32.6 g/dL (ref 30.0–36.0)
MCV: 91.4 fL (ref 78.0–100.0)
MCV: 91.2 fL (ref 78.0–100.0)
Platelets: 216 10*3/uL (ref 150–400)
Platelets: 232 10*3/uL (ref 150–400)
RBC: 2.45 MIL/uL — ABNORMAL LOW (ref 3.87–5.11)
RBC: 3.06 MIL/uL — ABNORMAL LOW (ref 3.87–5.11)
RDW: 14 % (ref 11.5–15.5)
RDW: 14.6 % (ref 11.5–15.5)
WBC: 9.7 10*3/uL (ref 4.0–10.5)
WBC: 11.9 10*3/uL — ABNORMAL HIGH (ref 4.0–10.5)

## 2014-05-31 LAB — BASIC METABOLIC PANEL
Anion gap: 9 (ref 5–15)
BUN: 12 mg/dL (ref 6–23)
CALCIUM: 8.1 mg/dL — AB (ref 8.4–10.5)
CO2: 25 mEq/L (ref 19–32)
CREATININE: 0.79 mg/dL (ref 0.50–1.10)
Chloride: 104 mEq/L (ref 96–112)
GFR calc Af Amer: >90 mL/min (ref >90)
GFR, EST NON AFRICAN AMERICAN: 87 mL/min — AB (ref >90)
GLUCOSE: 121 mg/dL — AB (ref 70–99)
Potassium: 4.3 mEq/L (ref 3.7–5.3)
Sodium: 138 mEq/L (ref 137–147)

## 2014-05-31 LAB — PREPARE RBC (CROSSMATCH)

## 2014-05-31 MED ORDER — SODIUM CHLORIDE 0.9 % IV SOLN
Freq: Once | INTRAVENOUS | Status: AC
  Administered 2014-05-31: 10:00:00 via INTRAVENOUS

## 2014-05-31 MED ORDER — FUROSEMIDE 10 MG/ML IJ SOLN
20.0000 mg | Freq: Once | INTRAMUSCULAR | Status: AC
Start: 2014-05-31 — End: 2014-05-31
  Administered 2014-05-31: 20 mg via INTRAVENOUS
  Filled 2014-05-31: qty 2

## 2014-05-31 NOTE — Evaluation (Signed)
Occupational Therapy Evaluation and Discharge Patient Details Name: Nichole Frank MRN: [id number] DOB: [birth year] Today's Date: 05/31/2014    History of Present Illness 63 yo female s/p anterior R THA WBAT PMH: obese, IBS, HTN, depression, anxiety, Bil TKA, right carpal tunnel release, rotator cuff repair, gastric bypass surgery   Clinical Impression   This 63 yo female admitted and underwent above presents to acute OT with posterior hip precautions, decreased standing balance, decreased mobility all affecting her ability to take care of herself. She will need continued OT at SNF to get to a Mod I/Independent level to return home by herself. Acute OT will sign off    Follow Up Recommendations  SNF    Equipment Recommendations   (TBD at next venue)       Precautions / Restrictions Precautions Precautions: Fall Restrictions Weight Bearing Restrictions: No RLE Weight Bearing: Weight bearing as tolerated      Mobility Bed Mobility Overal bed mobility: Needs Assistance Bed Mobility: Sit to Supine       Sit to supine: Min assist (for RLE)   General bed mobility comments: pt in recliner.    Transfers Overall transfer level: Needs assistance Equipment used: Rolling walker (2 wheeled) Transfers: Sit to/from Stand Sit to Stand: Min assist         General transfer comment: pt able to come to stand with MinG from recliner, but needed MinA from toilet with grab bar.      Balance Overall balance assessment: Needs assistance Sitting-balance support: No upper extremity supported;Feet supported Sitting balance-Leahy Scale: Good     Standing balance support: No upper extremity supported;During functional activity Standing balance-Leahy Scale: Fair Standing balance comment: pt able to stand for short periods of time without UE support.                              ADL Overall ADL's : Needs assistance/impaired Eating/Feeding: Independent;Sitting   Grooming:  Set up;Sitting   Upper Body Bathing: Set up;Sitting   Lower Body Bathing: Minimal assistance;Adhering to hip precautions;With adaptive equipment (with min A sit<>stand)   Upper Body Dressing : Set up;Sitting   Lower Body Dressing: Minimal assistance;With adaptive equipment;Adhering to hip precautions (with min A sit<>stand)   Toilet Transfer: Minimal assistance;Ambulation;RW   Toileting- Clothing Manipulation and Hygiene: Min guard (with min A sit<>stand)                         Pertinent Vitals/Pain Pain Assessment: 0-10 Pain Score: 8  Pain Location: R hip/thigh Pain Descriptors / Indicators: Aching;Burning Pain Intervention(s): Monitored during session;Repositioned;RN gave pain meds during session        Extremity/Trunk Assessment Upper Extremity Assessment Upper Extremity Assessment: Overall WFL for tasks assessed              Cognition Arousal/Alertness: Awake/alert Behavior During Therapy: WFL for tasks assessed/performed Overall Cognitive Status: Within Functional Limits for tasks assessed                                Home Living Family/patient expects to be discharged to:: Skilled nursing facility  OT Diagnosis: Generalized weakness;Acute pain   OT Problem List: Decreased strength;Decreased range of motion;Impaired balance (sitting and/or standing);Pain;Obesity;Decreased knowledge of use of DME or AE      OT Goals(Current goals can be found in the care plan section) Acute Rehab OT Goals Patient Stated Goal: to rehab at [facility] place tomorrow and then home  OT Frequency:                End of Session Equipment Utilized During Treatment: Rolling walker  Activity Tolerance: Patient tolerated treatment well Patient left: in chair   Time: 2585-2778 OT Time Calculation (min): 35 min Charges:  OT General Charges $OT Visit: 1 Procedure OT Evaluation $Initial OT  Evaluation Tier I: 1 Procedure OT Treatments $Self Care/Home Management : 23-37 mins  Almon Register [phone] 05/31/2014, 4:40 PM

## 2014-05-31 NOTE — Progress Notes (Signed)
Patient unable to void. Only able to dribble a little at a time. PO fluid was encouraged and patient was helped up to [facility] with out any success. Bladder scan done showed 368ml in bladder. In and out cath done at 0130 got out 970ml of clear yellow urine. Patient resting comfortably. Instructed to continue fluid intake and notify staff when patient needs to void. Will continue to monitor.

## 2014-05-31 NOTE — Progress Notes (Signed)
Physical Therapy Treatment Patient Details Name: Nichole Frank MRN: [id number] DOB: 1950/10/10 Today's Date: 05/31/2014    History of Present Illness 63 yo female s/p anterior R THA WBAT PMH: obese, IBS, HTN, depression, anxiety    PT Comments    Pt is POD #1 and is limited in her mobility due to low Hgb levels.  What mobility we did only required min assist.  Exercises initiated and after 1 unit of PRBCs we will be able to walk.  Pt is eager to d/c to SNF and still hopeful for today.    Follow Up Recommendations  SNF (pre arranged [place])     Equipment Recommendations  None recommended by PT    Recommendations for Other Services   NA     Precautions / Restrictions Precautions Precautions: None Restrictions RLE Weight Bearing: Weight bearing as tolerated    Mobility  Bed Mobility Overal bed mobility: Needs Assistance Bed Mobility: Supine to Sit     Supine to sit: Min assist     General bed mobility comments: Min assist of right leg and then after right leg progressed to EOB, trunk to get to sitting. Verbal cues for 1/2 bridge technique.  Warm up exercises preformed in bed before attempts at sitting.   Transfers Overall transfer level: Needs assistance Equipment used: Rolling walker (2 wheeled) Transfers: Sit to/from Stand Sit to Stand: Min assist Stand pivot transfers: Min assist       General transfer comment: Min assist to support trunk during transitions to stand.  Reinforced safe hand placement.  Pt stood from bed, recliner, and toilet.   Ambulation/Gait Ambulation/Gait assistance: Min assist Ambulation Distance (Feet): 15 Feet Assistive device: Rolling walker (2 wheeled) Gait Pattern/deviations: Step-through pattern;Antalgic Gait velocity: decreased   General Gait Details: Deferred due to low Hgb and pt is symptomatic.  She is going to get two units of PRBCs today, but they have not been started yet.               Balance Overall  balance assessment: Needs assistance Sitting-balance support: Feet supported;No upper extremity supported Sitting balance-Leahy Scale: Good     Standing balance support: Bilateral upper extremity supported;Single extremity supported;No upper extremity supported Standing balance-Leahy Scale: Fair Standing balance comment: Pt continues to be more mildly lightheaded in sitting and standing today.  Precautions taken due to low Hgb.                     Cognition Arousal/Alertness: Awake/alert Behavior During Therapy: WFL for tasks assessed/performed Overall Cognitive Status: Within Functional Limits for tasks assessed                      Exercises Total Joint Exercises Ankle Circles/Pumps: AROM;Both;10 reps;Supine Quad Sets: AROM;Right;10 reps;Supine Heel Slides: AAROM;Right;10 reps;Supine Hip ABduction/ADduction: AAROM;Right;10 reps;Supine Long Arc Quad: AROM;Right;10 reps;Seated Other Exercises Other Exercises: encouraged ankle pumps for antiembolic purposes.     General Comments General comments (skin integrity, edema, etc.): RN working on dressing when PT entered room.  She had been bleeding profusely from her incision site.  When pt got to the bathroom the dressing was saturated and pt was bleeding streams of blood onto the floor.  RN made aware again and took over re cleaning and re dressing pt.        Pertinent Vitals/Pain Pain Assessment: 0-10 Pain Score: 5  Pain Location: right hip and groin Pain Descriptors / Indicators: Aching;Burning Pain Intervention(s): Limited activity within patient's  tolerance;Monitored during session;Repositioned    Home Living Family/patient expects to be discharged to:: Private residence Living Arrangements: Alone   Type of Home: House Home Access: Stairs to enter Entrance Stairs-Rails: Right Home Layout: One level Home Equipment: Walker - standard;Cane - quad;Bedside commode;Shower seat      Prior Function Level of  Independence: Independent with assistive device(s)      Comments: used cane for gait, driving   PT Goals (current goals can now be found in the care plan section) Acute Rehab PT Goals Patient Stated Goal: to leave today if she can PT Goal Formulation: With patient Time For Goal Achievement: 06/07/14 Potential to Achieve Goals: Good Progress towards PT goals: Progressing toward goals    Frequency  7X/week    PT Plan Current plan remains appropriate       End of Session   Activity Tolerance: Treatment limited secondary to medical complications (Comment) (by low Hgb) Patient left: in chair;with call bell/phone within reach     Time: 1016-1045 PT Time Calculation (min) (ACUTE ONLY): 29 min  Charges:  $Therapeutic Exercise: 8-22 mins $Therapeutic Activity: 8-22 mins                      Hydee Fleece B. [place], [place], DPT [phone]   05/31/2014, 12:03 PM

## 2014-05-31 NOTE — Op Note (Signed)
NAMEDAYANNE, YIU NO.:  [id number]  MEDICAL RECORD NO.:  [id number]  LOCATION:  [id number]                        FACILITY:  [facility]  PHYSICIAN:  Ninetta Lights, M.D. DATE OF BIRTH:  25-Sep-1950  DATE OF PROCEDURE:  05/30/2014 DATE OF DISCHARGE:                              OPERATIVE REPORT   PREOPERATIVE DIAGNOSES:  Right hip end-stage degenerative arthritis with morbid obesity.  Generalized primary arthritis.  POSTOPERATIVE DIAGNOSES:  Right hip end-stage degenerative arthritis with morbid obesity.  Generalized primary arthritis.  PROCEDURE:  Direct anterior right total hip replacement utilizing Stryker prosthesis.  Press-fit 54-mm acetabular component screw fixation x2 with 36-mm internal diameter liner.  A press-fit #6 Accolade stem.  A 36, -2.5 Biolox head.  SURGEON:  Ninetta Lights, M.D.  ASSISTANT:  Doran Stabler, PA, present throughout the entire case and necessary for timely completion of procedure.  ANESTHESIA:  General.  BLOOD LOSS:  1800 mL.  BLOOD GIVEN:  None.  SPECIMENS:  None.  CULTURES:  None.  COMPLICATIONS:  None.  DRESSINGS:  Soft, compressive.  DESCRIPTION OF PROCEDURE:  The patient was brought to the operating room, placed on the operating table in supine position.  Every aspect of the case from anesthesia to positioning to the entire surgical approach made much more complex and difficult because of morbid obesity.  After being prepped and draped in usual sterile fashion with appropriate positioning, longitudinal incision just posterior to the anterior- superior iliac spine extending distally.  Skin, subcutaneous tissue, and generous layers of fascia incised.  Fascia over the tensor incised. Muscle retracted.  The deep fascia and capsule exposed.  All of this resected with cautery.  Retractors put in place.  Femoral neck cut 1 fingerbreadth above the lesser trochanter.  A napkin ring cut out and both that and the head  removed.  Acetabulum exposed.  Grade 4 changes throughout.  Redundant labral tissue removed.  This was brought to good bleeding bone for a 54-mm component placed at 40 degrees of abduction, very slight anteversion.  This was seated in place, good capturing and fixation augmented with 2 screws through the cup.  A 36-mm internal diameter liner.  I then sequentially worked to free up the femur.  I had to extend the incision because of how deep the incision was because of fat.  Initial cookie cutter and then broaches were used to open up the femur, brought up to good fitting for #6 component.  After appropriate trials, a #6 component was seated.  A 36 -2.5 Biolox head attached.  Hip reduced.  Great stability, equal leg lengths.  Wound copiously irrigated.  Injected with Exparel.  Fascia closed with #1 Vicryl in numerous layers and then subcutaneous subcuticular closure of the wound. Margins were injected with Marcaine.  Sterile compressive dressing applied.  Anesthesia reversed.  Brought to the recovery room.  Tolerated the surgery well.  No complications.     Ninetta Lights, M.D.     DFM/MEDQ  D:  05/30/2014  T:  05/30/2014  Job:  [id number]

## 2014-05-31 NOTE — Care Management Note (Signed)
CARE MANAGEMENT NOTE 05/31/2014  Patient:  Nichole Frank, Nichole Frank   Account Number:  [id number]  Date Initiated:  05/31/2014  Documentation initiated by:  Ricki Miller  Subjective/Objective Assessment:   63 yr old female admitted with DJD of the right hip.Patient had a right hip arthroplasty.     Action/Plan:   Patient will go to shortterm rehab. Has stated she will go to [facility]. Social worker is aware.   Anticipated DC Date:  06/01/2014   Anticipated DC Plan:  SKILLED NURSING FACILITY  In-house referral  Clinical Social Worker      DC Planning Services  CM consult      [facility] Choice  NA   Choice offered to / List presented to:  NA   DME arranged  NA        HH arranged  NA      Status of service:  Completed, signed off Medicare Important Message given?  NA - LOS <3 / Initial given by admissions (If response is "NO", the following Medicare IM given date fields will be blank) Date Medicare IM given:   Medicare IM given by:   Date Additional Medicare IM given:   Additional Medicare IM given by:    Discharge Disposition:  [place]  Per UR Regulation:  Reviewed for med. necessity/level of care/duration of stay  If discussed at [place] of Stay Meetings, dates discussed:    Comments:

## 2014-05-31 NOTE — Progress Notes (Signed)
Subjective: 1 Day Post-Op Procedure(s) (LRB): TOTAL HIP ARTHROPLASTY ANTERIOR APPROACH (Right) Patient reports pain as 5 on 0-10 scale.  Patient has developed lightheadedness/dizziness.  She also c/o inability to void since surgery.  No nausea/vomiting.  Positive flatus but no bm.  Tolerating diet.  Objective: Vital signs in last 24 hours: Temp:  [97.4 F (36.3 C)-98.3 F (36.8 C)] 98.3 F (36.8 C) (12/17 0523) Pulse Rate:  [58-79] 70 (12/17 0523) Resp:  [12-23] 17 (12/17 0523) BP: (101-161)/(41-70) 112/46 mmHg (12/17 0523) SpO2:  [92 %-100 %] 95 % (12/17 0523)  Intake/Output from previous day: 12/16 0701 - 12/17 0700 In: 4390 [P.O.:240; I.V.:3400; IV Piggyback:750] Out: 2550 [Urine:950; Blood:1600] Intake/Output this shift:     Recent Labs  05/31/14 0545  HGB 7.2*    Recent Labs  05/31/14 0545  WBC 9.7  RBC 2.45*  HCT 22.4*  PLT 216    Recent Labs  05/31/14 0545  NA 138  K 4.3  CL 104  CO2 25  BUN 12  CREATININE 0.79  GLUCOSE 121*  CALCIUM 8.1*   No results for input(s): LABPT, INR in the last 72 hours.  Neurologically intact Neurovascular intact Sensation intact distally Intact pulses distally Dorsiflexion/Plantar flexion intact Compartment soft  Negative homans bilaterally No drainage noted through dressing (this has been changed since surgery)  Assessment/Plan: 1 Day Post-Op Procedure(s) (LRB): TOTAL HIP ARTHROPLASTY ANTERIOR APPROACH (Right) Advance diet Up with therapy D/C IV fluids Discharge to SNF most likely today WBAT RLE- DA total hip replacement precautions ABLA- patient symptomatic.  Will transfuse with 2 units PRBC Acute urinary retention-please avoid foley if possible.  Hopeful that this will resolve once up with PT  ANTON, M. LINDSEY 05/31/2014, 7:05 AM

## 2014-05-31 NOTE — Evaluation (Signed)
Physical Therapy Evaluation Patient Details Name: Nichole Frank MRN: [id number] DOB: 05-Feb-1951 Today's Date: 05/31/2014   History of Present Illness  63 yo female s/p anterior R THA WBAT PMH: obese, IBS, HTN, depression, anxiety  Clinical Impression  Pt is POD #0 and is mobilizing well, although she has moderate lightheadedness in both sitting and standing and her incision is profusely oozing blood.  We were limited to in room gait and RN is addressing wound and dressing.  Pt has pre-arranged [facility] SNF at discharge.  PT to follow acutely for deficits listed below.       Follow Up Recommendations SNF (pre arranged [facility])    Equipment Recommendations  None recommended by PT    Recommendations for Other Services   NA    Precautions / Restrictions Precautions Precautions: None Restrictions RLE Weight Bearing: Weight bearing as tolerated      Mobility  Bed Mobility Overal bed mobility: Needs Assistance Bed Mobility: Supine to Sit     Supine to sit: Min assist     General bed mobility comments: Min assist to help progress right leg over EOB and support trunk during transitions.  Verbal cues for 1/2 bridge technique.    Transfers Overall transfer level: Needs assistance Equipment used: Rolling walker (2 wheeled) Transfers: Sit to/from [facility] Sit to Stand: Min assist Stand pivot transfers: Min assist       General transfer comment: Min assist to support trunk during transitions.  Verbal cues for safe hand placement.   Ambulation/Gait Ambulation/Gait assistance: Min assist Ambulation Distance (Feet): 15 Feet Assistive device: Rolling walker (2 wheeled) Gait Pattern/deviations: Step-through pattern;Antalgic Gait velocity: decreased   General Gait Details: Pt was able to walk into the bathroom to attempt urination (she did not feel she could go in the room on the [facility] as it was pinching her legs shut. Pt with moderately antalgic  gait pattern and moderate lightheadedness with gait.           Balance Overall balance assessment: Needs assistance Sitting-balance support: Feet supported;No upper extremity supported Sitting balance-Leahy Scale: Good     Standing balance support: Bilateral upper extremity supported Standing balance-Leahy Scale: Poor Standing balance comment: Pt lightheaded in both sitting and standing, so limited gait to the bathroom as she was moderately lightheaded.                              Pertinent Vitals/Pain Pain Assessment: No/denies pain (at rest)    Home Living Family/patient expects to be discharged to:: Private residence Living Arrangements: Alone   Type of Home: House Home Access: Stairs to enter Entrance Stairs-Rails: Right Entrance Stairs-Number of Steps: 2 Home Layout: One level Home Equipment: Walker - standard;Cane - quad;Bedside commode;Shower seat      Prior Function Level of Independence: Independent with assistive device(s)         Comments: used cane for gait, driving     Hand Dominance   Dominant Hand: Right    Extremity/Trunk Assessment   Upper Extremity Assessment: Defer to OT evaluation           Lower Extremity Assessment: RLE deficits/detail RLE Deficits / Details: right leg with normal post op pain and weakness. Ankle 3/5, knee 3-/5, hip 2/5    Cervical / Trunk Assessment: Normal  Communication   Communication: No difficulties  Cognition Arousal/Alertness: Awake/alert Behavior During Therapy: WFL for tasks assessed/performed Overall Cognitive Status: Within Functional  Limits for tasks assessed                      General Comments General comments (skin integrity, edema, etc.): RN working on dressing when PT entered room.  She had been bleeding profusely from her incision site.  When pt got to the bathroom the dressing was saturated and pt was bleeding streams of blood onto the floor.  RN made aware again and  took over re cleaning and re dressing pt.      Exercises Other Exercises Other Exercises: encouraged ankle pumps for antiembolic purposes.       Assessment/Plan    PT Assessment Patient needs continued PT services  PT Diagnosis Difficulty walking;Abnormality of gait;Generalized weakness;Acute pain   PT Problem List Decreased strength;Decreased activity tolerance;Decreased balance;Decreased mobility;Decreased range of motion;Decreased knowledge of use of DME;Obesity;Pain  PT Treatment Interventions DME instruction;Gait training;Functional mobility training;Therapeutic activities;Therapeutic exercise;Balance training;Neuromuscular re-education;Patient/family education;Modalities   PT Goals (Current goals can be found in the Care Plan section) Acute Rehab PT Goals Patient Stated Goal: to leave tomorrow PT Goal Formulation: With patient Time For Goal Achievement: 06/07/14 Potential to Achieve Goals: Good    Frequency 7X/week    End of Session   Activity Tolerance: Patient limited by fatigue;Patient limited by pain;Other (comment) (limited by bleeding at incision site) Patient left: in chair;Other (comment) (on [facility] with RN and RN tech) Nurse Communication: Mobility status         Time: [phone] PT Time Calculation (min) (ACUTE ONLY): 37 min   Charges:   PT Evaluation $Initial PT Evaluation Tier I: 1 Procedure PT Treatments $Therapeutic Activity: 8-22 mins        Geordie Nooney B. Naven Giambalvo, PT, DPT [phone]   05/31/2014, 9:40 AM

## 2014-05-31 NOTE — Progress Notes (Signed)
Physical Therapy Treatment Patient Details Name: Nichole Frank MRN: [id number] DOB: 03/30/1951 Today's Date: 05/31/2014    History of Present Illness 63 yo female s/p anterior R THA WBAT PMH: obese, IBS, HTN, depression, anxiety    PT Comments    Pt able to progress ambulation this pm.  Pt had received her first unit of blood and indicated starting to feel stronger.  Pt anticipates D/C to [facility].  Anticipate continued progress.    Follow Up Recommendations  SNF     Equipment Recommendations  None recommended by PT    Recommendations for Other Services       Precautions / Restrictions Precautions Precautions: None Restrictions Weight Bearing Restrictions: Yes RLE Weight Bearing: Weight bearing as tolerated    Mobility  Bed Mobility            General bed mobility comments: pt in recliner.    Transfers Overall transfer level: Needs assistance Equipment used: Rolling walker (2 wheeled) Transfers: Sit to/from Stand Sit to Stand: Min assist;Min guard Stand pivot transfers: Min assist       General transfer comment: pt able to come to stand with MinG from recliner, but needed MinA from toilet with grab bar.    Ambulation/Gait Ambulation/Gait assistance: Min guard Ambulation Distance (Feet): 30 Feet (x2) Assistive device: Rolling walker (2 wheeled) Gait Pattern/deviations: Step-to pattern;Decreased step length - left;Decreased stance time - right     General Gait Details: cues for more upright posture and encouragement.  pt indicates dizziness when coming to stand from IV pain meds given just prior to mobility.     Stairs            Wheelchair Mobility    Modified Rankin (Stroke Patients Only)       Balance Overall balance assessment: Needs assistance Sitting-balance support: No upper extremity supported;Feet supported Sitting balance-Leahy Scale: Good     Standing balance support: No upper extremity supported;During  functional activity Standing balance-Leahy Scale: Fair Standing balance comment: pt able to stand for short periods of time without UE support.                      Cognition Arousal/Alertness: Awake/alert Behavior During Therapy: WFL for tasks assessed/performed Overall Cognitive Status: Within Functional Limits for tasks assessed                      Exercises     General Comments        Pertinent Vitals/Pain Pain Assessment: 0-10 Pain Score: 8  Pain Location: R hip/thigh Pain Descriptors / Indicators: Aching;Burning Pain Intervention(s): Monitored during session;Repositioned;RN gave pain meds during session    Home Living                      Prior Function            PT Goals (current goals can now be found in the care plan section) Acute Rehab PT Goals Patient Stated Goal: to leave tomorrow for [facility].   PT Goal Formulation: With patient Time For Goal Achievement: 06/07/14 Potential to Achieve Goals: Good Progress towards PT goals: Progressing toward goals    Frequency  7X/week    PT Plan Current plan remains appropriate    Co-evaluation             End of Session Equipment Utilized During Treatment: Gait belt Activity Tolerance: Patient tolerated treatment well Patient left: in chair;with call bell/phone  within reach     Time: 1312-1400 PT Time Calculation (min) (ACUTE ONLY): 48 min  Charges:  $Gait Training: 23-37 mins $Therapeutic Activity: 8-22 mins                    G CodesCatarina Hartshorn, [place] 05/31/2014, 2:19 PM

## 2014-06-01 ENCOUNTER — Encounter (HOSPITAL_COMMUNITY): Payer: Self-pay | Admitting: Orthopedic Surgery

## 2014-06-01 LAB — TYPE AND SCREEN
ABO/RH(D): O POS
ANTIBODY SCREEN: NEGATIVE
UNIT DIVISION: 0
UNIT DIVISION: 0

## 2014-06-01 LAB — CBC
HEMATOCRIT: 25.4 % — AB (ref 36.0–46.0)
Hemoglobin: 8.3 g/dL — ABNORMAL LOW (ref 12.0–15.0)
MCH: 29.3 pg (ref 26.0–34.0)
MCHC: 32.7 g/dL (ref 30.0–36.0)
MCV: 89.8 fL (ref 78.0–100.0)
Platelets: 180 10*3/uL (ref 150–400)
RBC: 2.83 MIL/uL — ABNORMAL LOW (ref 3.87–5.11)
RDW: 14.7 % (ref 11.5–15.5)
WBC: 8.9 10*3/uL (ref 4.0–10.5)

## 2014-06-01 LAB — BASIC METABOLIC PANEL
Anion gap: 9 (ref 5–15)
BUN: 20 mg/dL (ref 6–23)
CHLORIDE: 102 meq/L (ref 96–112)
CO2: 24 mEq/L (ref 19–32)
CREATININE: 1.31 mg/dL — AB (ref 0.50–1.10)
Calcium: 8.2 mg/dL — ABNORMAL LOW (ref 8.4–10.5)
GFR calc non Af Amer: 43 mL/min — ABNORMAL LOW (ref >90)
GFR, EST AFRICAN AMERICAN: 49 mL/min — AB (ref >90)
Glucose, Bld: 127 mg/dL — ABNORMAL HIGH (ref 70–99)
Potassium: 4.1 mEq/L (ref 3.7–5.3)
Sodium: 135 mEq/L — ABNORMAL LOW (ref 137–147)

## 2014-06-01 NOTE — Progress Notes (Signed)
Subjective: 2 Days Post-Op Procedure(s) (LRB): TOTAL HIP ARTHROPLASTY ANTERIOR APPROACH (Right) Patient reports pain as 2 on 0-10 scale.  No lightheadedness/dizziness.  She feels much better after getting blood yesterday.  No nausea/vomiting, chest pain/sob.  Tolerating diet and working well with PT.  Would like to be d/c to [facility] today.  Objective: Vital signs in last 24 hours: Temp:  [98 F (36.7 C)-99.1 F (37.3 C)] 98.7 F (37.1 C) (12/18 0545) Pulse Rate:  [55-74] 63 (12/18 0545) Resp:  [15-18] 17 (12/18 0545) BP: (89-110)/(39-52) 110/46 mmHg (12/18 0545) SpO2:  [92 %-100 %] 95 % (12/18 0545)  Intake/Output from previous day: 12/17 0701 - 12/18 0700 In: 2365 [P.O.:1680; Blood:685] Out: -  Intake/Output this shift:     Recent Labs  05/31/14 0545 05/31/14 1938 06/01/14 0504  HGB 7.2* 9.1* 8.3*    Recent Labs  05/31/14 1938 06/01/14 0504  WBC 11.9* 8.9  RBC 3.06* 2.83*  HCT 27.9* 25.4*  PLT 232 180    Recent Labs  05/31/14 0545 06/01/14 0504  NA 138 135*  K 4.3 4.1  CL 104 102  CO2 25 24  BUN 12 20  CREATININE 0.79 1.31*  GLUCOSE 121* 127*  CALCIUM 8.1* 8.2*   No results for input(s): LABPT, INR in the last 72 hours.  Neurologically intact Neurovascular intact Sensation intact distally Intact pulses distally Dorsiflexion/Plantar flexion intact Compartment soft  Negative homans bilaterally  Assessment/Plan: 2 Days Post-Op Procedure(s) (LRB): TOTAL HIP ARTHROPLASTY ANTERIOR APPROACH (Right) Advance diet Up with therapy Discharge to SNF as soon as approved.  Please work on this ASAP! WBAT RLE-anterior hip precautions Decreased kidney function-will possibly consult medicine ABLA-dropped to 8.3 day after transfusion of 2 units PRBC.  She is asymptomatic but we will continue to follow  [place], M. LINDSEY 06/01/2014, 8:20 AM

## 2014-06-01 NOTE — Clinical Social Work Note (Addendum)
CSW received notification from [facility] that they have received a denial for SNF/STR due to patient being "too well" with ambulations of 150+ feet.  CSW met with patient to discuss the denial and explore other options such as home health.  RNCM has been consulted and is agreeable to speaking with patient regarding such options. CSW also updated RN.   Nonnie Done, [place] [phone]  Psychiatric & Orthopedics (5N 1-16) Clinical Social Worker

## 2014-06-01 NOTE — Progress Notes (Signed)
Patient is upset that her insurance will not cover SNF and requests that PT come in to see her again today to teach her how to walk up stairs since she will now be discharged to home. Page sent to [facility].

## 2014-06-01 NOTE — Clinical Social Work Placement (Signed)
Clinical Social Work Department CLINICAL SOCIAL WORK PLACEMENT NOTE 06/01/2014  Patient:  Nichole Frank, Nichole Frank  Account Number:  [id number] Admit date:  05/30/2014  Clinical Social Worker:  Wylene Men  Date/time:  06/01/2014 01:14 PM  Clinical Social Work is seeking post-discharge placement for this patient at the following level of care:   SKILLED NURSING   (*CSW will update this form in Epic as items are completed)   05/31/2014  Patient/family provided with [facility] Department of Clinical Social Work's list of facilities offering this level of care within the geographic area requested by the patient (or if unable, by the patient's family).  05/31/2014  Patient/family informed of their freedom to choose among providers that offer the needed level of care, that participate in Medicare, Medicaid or managed care program needed by the patient, have an available bed and are willing to accept the patient.  05/31/2014  Patient/family informed of [facility]' ownership interest in [facility], as well as of the fact that they are under no obligation to receive care at this facility.  PASARR submitted to EDS on 05/31/2014 PASARR number received on 05/31/2014  FL2 transmitted to all facilities in geographic area requested by pt/family on  05/31/2014 FL2 transmitted to all facilities within larger geographic area on   Patient informed that his/her managed care company has contracts with or will negotiate with  certain facilities, including the following:     Patient/family informed of bed offers received:  05/31/2014 Patient chooses bed at [facility] Physician recommends and patient chooses bed at    Patient to be transferred to [facility] on  06/01/2014 Patient to be transferred to facility by [facility] Patient and family notified of transfer on 06/01/2014 Name of family member notified:  patient only patient will update sister  The following physician request were  entered in Epic:   Additional Comments:   Nonnie Done, [place] [phone]  Psychiatric & Orthopedics (5N 1-16) Clinical Social Worker

## 2014-06-01 NOTE — Clinical Social Work Psychosocial (Signed)
Clinical Social Work Department BRIEF PSYCHOSOCIAL ASSESSMENT 06/01/2014  Patient:  Nichole Frank, Nichole Frank     Account Number:  [id number]     Admit date:  05/30/2014  Clinical Social Worker:  Wylene Men  Date/Time:  06/01/2014 01:10 PM  Referred by:  Physician  Date Referred:  06/01/2014 Referred for  SNF Placement   Other Referral:   none   Interview type:  Patient Other interview type:   none    PSYCHOSOCIAL DATA Living Status:  ALONE Admitted from facility:   Level of care:   Primary support name:  Mardene Celeste Primary support relationship to patient:  SIBLING Degree of support available:   strong    CURRENT CONCERNS Current Concerns  Post-Acute Placement   Other Concerns:   none    SOCIAL WORK ASSESSMENT / PLAN CSW met with patient at bedside.  patient was sitting in the bedside chair and was alert and oriented during the course of this assessment.  PT is recommending SNF/STR at time of dc.  Patient has [place] and will require prior authorization for SNF.  Patient is aware.  Patient reports being pre-registered with [facility].  CSW confirmed patient has bed available at time of dc.  Patient is hopeful to return to independent living upon completion of STR.  Patient is appears motivated and eager to being therapy.   Assessment/plan status:  Psychosocial Support/Ongoing Assessment of Needs Other assessment/ plan:   FL2  PASARR   Information/referral to community resources:   SNF/STR    PATIENT'S/FAMILY'S RESPONSE TO PLAN OF CARE: Patient is agreeable to SNF/STR placement.  Patient has pre-registered with [facility].  Patient will need insurnace authorization prior to placement.       Nonnie Done, [place] [phone]  Psychiatric & Orthopedics (5N 1-16) Clinical Social Worker

## 2014-06-01 NOTE — Clinical Social Work Note (Signed)
Patient to dc today to [facility] SNF per MD order (pending [employer] insurance authorization) RN to call report prior to transportation to: [phone] Transportation: [facility]- CSW arranged for [facility] to transport patient belonging bag and Civil engineer, contracting.  CSW sent updated PT/OT clinicals to liaison at [facility] for insurance authorization.  Admission's coordinator to complete paperwork at bedside once insurance authorization has been received.  Admission's coordinator to transport patient's plant/balloon to SNF.    CSW reviewed dc plans with patient.  Patient is agreeable and has no requests for CSW to contact additional family members.  Patient is alert and oriented x4.  RN updated.  Nonnie Done, [place] [phone]  Psychiatric & Orthopedics (5N 1-16) Clinical Social Worker

## 2014-06-01 NOTE — Progress Notes (Signed)
Physical Therapy Treatment Patient Details Name: Nichole Frank MRN: [id number] DOB: [birth year] Today's Date: 06/01/2014    History of Present Illness 63 yo female s/p anterior R THA WBAT PMH: obese, IBS, HTN, depression, anxiety, Bil TKA, right carpal tunnel release, rotator cuff repair, gastric bypass surgery    PT Comments    Pt is progressing well with her mobility.  She continues to struggle with sit to stand transitions, but with multiple attempts, extra time and supervision she is able to stand without external physical assist.  She really wants to get out of here an to rehab.  She will need to be at a mod I level before returning home alone to be safe at home. PT will continue to follow acutely until d/c.    Follow Up Recommendations  SNF     Equipment Recommendations  None recommended by PT    Recommendations for Other Services   NA     Precautions / Restrictions Restrictions Weight Bearing Restrictions: Yes RLE Weight Bearing: Weight bearing as tolerated    Mobility   Transfers Overall transfer level: Needs assistance Equipment used: Rolling walker (2 wheeled) Transfers: Sit to/from Stand Sit to Stand: Supervision         General transfer comment: supervision for safety due to slow speed of transfer, multiple attempts and heavy reliance on upper extremity support.   Ambulation/Gait Ambulation/Gait assistance: Supervision Ambulation Distance (Feet): 150 Feet Assistive device: Rolling walker (2 wheeled) Gait Pattern/deviations: Step-through pattern;Antalgic Gait velocity: decreased Gait velocity interpretation: Below normal speed for age/gender General Gait Details: mildly antalgic gait pattern, slow speed.  Supervision for safety          Balance Overall balance assessment: Needs assistance Sitting-balance support: Feet supported;No upper extremity supported Sitting balance-Leahy Scale: Good     Standing balance support: No upper extremity  supported;Single extremity supported;Bilateral upper extremity supported Standing balance-Leahy Scale: Good                      Cognition Arousal/Alertness: Awake/alert Behavior During Therapy: WFL for tasks assessed/performed Overall Cognitive Status: Within Functional Limits for tasks assessed                      Exercises Total Joint Exercises Ankle Circles/Pumps: AROM;Both;10 reps;Standing Hip ABduction/ADduction: AROM;Right;10 reps;Standing Long Arc Quad: AROM;Right;10 reps;Seated Knee Flexion: AROM;Right;10 reps;Standing Marching in Standing: AROM;Right;10 reps;Standing Standing Hip Extension: AROM;Right;10 reps;Standing        Pertinent Vitals/Pain Pain Assessment: 0-10 Pain Score: 3  Pain Location: right anterior hip Pain Descriptors / Indicators: Aching;Burning Pain Intervention(s): Limited activity within patient's tolerance;Monitored during session;Repositioned           PT Goals (current goals can now be found in the care plan section) Acute Rehab PT Goals Patient Stated Goal: to rehab at [facility] tomorrow and then home Progress towards PT goals: Progressing toward goals    Frequency  7X/week    PT Plan Current plan remains appropriate       End of Session   Activity Tolerance: Patient limited by fatigue;Patient limited by pain Patient left: in chair;with call bell/phone within reach     Time: 1448-1856 PT Time Calculation (min) (ACUTE ONLY): 24 min  Charges:  $Gait Training: 8-22 mins $Therapeutic Exercise: 8-22 mins                      Jenea Dake B. Nakyia Dau, PT, DPT [phone]   06/01/2014, 9:10 AM

## 2014-06-01 NOTE — Progress Notes (Signed)
Patient has occasional periods of lightheadedness.

## 2014-06-01 NOTE — Progress Notes (Signed)
Physical Therapy Treatment Patient Details Name: Nichole Frank MRN: [id number] DOB: 05-Oct-1950 Today's Date: 06/01/2014    History of Present Illness 63 yo female s/p anterior R THA WBAT PMH: obese, IBS, HTN, depression, anxiety, Bil TKA, right carpal tunnel release, rotator cuff repair, gastric bypass surgery    PT Comments    Pt continues to progress well with mobility.  We are now practicing all mobility in anticipation for pt to go home with HHPT at discharge.  Pt struggles most with bed mobility, so this will need to be practiced again in AM as well as stair training and LE exercises.    Follow Up Recommendations  Home health PT     Equipment Recommendations  Rolling walker with 5" wheels    Recommendations for Other Services   NA     Precautions / Restrictions Restrictions RLE Weight Bearing: Weight bearing as tolerated    Mobility  Bed Mobility Overal bed mobility: Needs Assistance Bed Mobility: Supine to Sit;Sit to Supine     Supine to sit: Modified independent (Device/Increase time) (HOB flat using leg loop) Sit to supine: Min guard   General bed mobility comments: Min guard assist for safety to help pt strategize how to get back into the bed.  We decided that since she can't move her bed she may have to switch her pillow to the opposite end of the bed to make it easeir to get in and out.   Transfers Overall transfer level: Needs assistance Equipment used: Rolling walker (2 wheeled) Transfers: Sit to/from Stand Sit to Stand: Modified independent (Device/Increase time)         General transfer comment: Pt able to stand safely and easily from both bed and recliner chair with armrests.  We talked at length re: good sitting choices as far as higher chair and armrest preferred.   Ambulation/Gait Ambulation/Gait assistance: Modified independent (Device/Increase time) Ambulation Distance (Feet): 150 Feet Assistive device: Rolling walker (2 wheeled) Gait  Pattern/deviations: Step-through pattern;Antalgic Gait velocity: decreased Gait velocity interpretation: Below normal speed for age/gender General Gait Details: Pt continues to have mildly antalgic gait pattern, but is moving well otehrwise.    Stairs Stairs: Yes Stairs assistance: Min guard Stair Management: One rail Right;Forwards;Sideways;With walker Number of Stairs: 5 (x2) General stair comments: Pt educated on how to fold walker, use rail and folded walker to ascend and descend stairs simulating home entry.  Min guard assist for saftety.          Balance Overall balance assessment: Needs assistance Sitting-balance support: Feet supported;No upper extremity supported Sitting balance-Leahy Scale: Good     Standing balance support: Single extremity supported Standing balance-Leahy Scale: Good                      Cognition Arousal/Alertness: Awake/alert Behavior During Therapy: WFL for tasks assessed/performed Overall Cognitive Status: Within Functional Limits for tasks assessed                             Pertinent Vitals/Pain Pain Assessment: 0-10 Pain Score: 7  Pain Location: right hip Pain Descriptors / Indicators: Aching;Burning Pain Intervention(s): Limited activity within patient's tolerance;Monitored during session;Repositioned;Premedicated before session           PT Goals (current goals can now be found in the care plan section) Acute Rehab PT Goals Patient Stated Goal: to go to her book signing on sunday Progress towards PT goals: Progressing toward  goals    Frequency  7X/week    PT Plan Discharge plan needs to be updated       End of Session   Activity Tolerance: Patient limited by fatigue;Patient limited by pain Patient left: in chair;with call bell/phone within reach     Time: 5947-0761 PT Time Calculation (min) (ACUTE ONLY): 35 min  Charges:  $Gait Training: 8-22 mins $Therapeutic Activity: 8-22 mins                       Jayvan Mcshan B. Luwanda Starr, [place], DPT [phone]   06/01/2014, 5:00 PM

## 2014-06-01 NOTE — Care Management Note (Addendum)
CARE MANAGEMENT NOTE 06/01/2014  Patient:  Nichole Frank, Nichole Frank   Account Number:  [id number]  Date Initiated:  05/31/2014  Documentation initiated by:  Ricki Miller  Subjective/Objective Assessment:   63 yr old female admitted with DJD of the right hip.Patient had a right hip arthroplasty.     Anticipated DC Date:  06/02/2014   Anticipated DC Plan:  [place]  In-house referral  Clinical Social Worker      DC Forensic scientist  CM consult      PAC Choice  [place]   Choice offered to / List presented to:  C-1 Patient   DME arranged  Vassie Moselle      DME agency  [place] arranged  HH-2 PT  HH-1 RN      [facility] agency  [facility].   Status of service:  Completed, signed off Discharge Disposition:  [place]  Per UR Regulation:  Reviewed for med. necessity/level of care/duration of stay  If discussed at [place] of Stay Meetings, dates discussed:    Comments:  06/01/14 3:06pm Ricki Miller, RN BSN Case Manager Patient was denied approval for SNF. Case Manager spoke with her concerning home health and DME needs. Choice was offered.Patient was preoperatively setup with [facility]. Start of care 06/03/14 Patient has 3 steps to enter her home. Physical therapy will work with patient on stairs 06/01/14 and again in the AM prior to discharge.

## 2014-06-02 LAB — BASIC METABOLIC PANEL
ANION GAP: 9 (ref 5–15)
BUN: 15 mg/dL (ref 6–23)
CALCIUM: 8.4 mg/dL (ref 8.4–10.5)
CHLORIDE: 102 meq/L (ref 96–112)
CO2: 25 mEq/L (ref 19–32)
Creatinine, Ser: 0.93 mg/dL (ref 0.50–1.10)
GFR calc non Af Amer: 65 mL/min — ABNORMAL LOW (ref >90)
GFR, EST AFRICAN AMERICAN: 75 mL/min — AB (ref >90)
GLUCOSE: 89 mg/dL (ref 70–99)
POTASSIUM: 3.8 meq/L (ref 3.7–5.3)
Sodium: 136 mEq/L — ABNORMAL LOW (ref 137–147)

## 2014-06-02 LAB — CBC
HEMATOCRIT: 26.6 % — AB (ref 36.0–46.0)
HEMOGLOBIN: 8.5 g/dL — AB (ref 12.0–15.0)
MCH: 29.1 pg (ref 26.0–34.0)
MCHC: 32 g/dL (ref 30.0–36.0)
MCV: 91.1 fL (ref 78.0–100.0)
Platelets: 193 10*3/uL (ref 150–400)
RBC: 2.92 MIL/uL — AB (ref 3.87–5.11)
RDW: 14.4 % (ref 11.5–15.5)
WBC: 7 10*3/uL (ref 4.0–10.5)

## 2014-06-02 NOTE — Progress Notes (Signed)
Physical Therapy Treatment Patient Details Name: Nichole Frank MRN: [id number] DOB: 1951/02/10 Today's Date: 06/02/2014    History of Present Illness 63 yo female s/p anterior R THA WBAT PMH: obese, IBS, HTN, depression, anxiety, Bil TKA, right carpal tunnel release, rotator cuff repair, gastric bypass surgery    PT Comments    Pt is progressing well with her mobility and all except for the stairs at a mod I level of moving with RW.  Pt is ready for d/c home and is hoping to go today before 11am.  PT will continue to follow acutely until d/c.    Follow Up Recommendations  Home health PT     Equipment Recommendations  Rolling walker with 5" wheels    Recommendations for Other Services   NA     Precautions / Restrictions Restrictions RLE Weight Bearing: Weight bearing as tolerated    Mobility  Bed Mobility Overal bed mobility: Modified Independent Bed Mobility: Sit to Supine;Supine to Sit     Supine to sit: Modified independent (Device/Increase time) Sit to supine: Modified independent (Device/Increase time)   General bed mobility comments: After multiple attempts to lift leg from sitting position, unsuccesfully, pt was able to crawl into the bed safely like she was doing PTA.   Transfers Overall transfer level: Modified independent Equipment used: Rolling walker (2 wheeled) Transfers: Sit to/from Stand Sit to Stand: Modified independent (Device/Increase time)         General transfer comment: using hands for transitions  Ambulation/Gait Ambulation/Gait assistance: Modified independent (Device/Increase time) Ambulation Distance (Feet): 150 Feet Assistive device: Rolling walker (2 wheeled) Gait Pattern/deviations: Step-through pattern;Antalgic Gait velocity: decreased Gait velocity interpretation: Below normal speed for age/gender General Gait Details: continued mildly antalgic gait pattern.    Stairs Stairs: Yes Stairs assistance: Min guard Stair  Management: One rail Right;Forwards;Sideways;With walker Number of Stairs: 5 (x2) General stair comments: Pt able to verbalize correct LE technique, however, still needed cues for safety when trying to ascend and descend the stairs with folded up RW.           Balance Overall balance assessment: Needs assistance Sitting-balance support: Feet supported Sitting balance-Leahy Scale: Good     Standing balance support: Bilateral upper extremity supported;Single extremity supported;No upper extremity supported Standing balance-Leahy Scale: Good                      Cognition Arousal/Alertness: Awake/alert Behavior During Therapy: WFL for tasks assessed/performed Overall Cognitive Status: Within Functional Limits for tasks assessed                      Exercises Total Joint Exercises Ankle Circles/Pumps: AROM;Both;10 reps;Supine Quad Sets: AROM;Right;10 reps;Supine Heel Slides: AAROM;Right;10 reps;Supine Hip ABduction/ADduction: AAROM;Right;10 reps;Supine Long Arc Quad: AROM;Right;10 reps;Seated Knee Flexion: AROM;Right;10 reps;Standing Marching in Standing: AROM;Right;10 reps;Standing Standing Hip Extension: AROM;Right;10 reps;Standing        Pertinent Vitals/Pain Pain Assessment: 0-10 Pain Score: 5  Pain Location: right hip Pain Descriptors / Indicators: Aching;Burning Pain Intervention(s): Limited activity within patient's tolerance;Monitored during session;Repositioned;Patient requesting pain meds-RN notified           PT Goals (current goals can now be found in the care plan section) Acute Rehab PT Goals Patient Stated Goal: to go to her book signing today Progress towards PT goals: Progressing toward goals    Frequency  7X/week    PT Plan Current plan remains appropriate       End of Session  Activity Tolerance: Patient limited by pain Patient left: in chair;with call bell/phone within reach     Time: 0811-0845 PT Time Calculation  (min) (ACUTE ONLY): 34 min  Charges:  $Gait Training: 8-22 mins $Therapeutic Exercise: 8-22 mins                      Habiba Treloar B. Nathanuel Cabreja, PT, DPT [phone]   06/02/2014, 8:53 AM

## 2014-06-02 NOTE — Care Management Note (Signed)
    Page 1 of 2   06/02/2014     9:04:56 AM CARE MANAGEMENT NOTE 06/02/2014  Patient:  Nichole Frank, Nichole Frank   Account Number:  [id number]  Date Initiated:  05/31/2014  Documentation initiated by:  Ricki Miller  Subjective/Objective Assessment:   63 yr old female admitted with DJD of the right hip.Patient had a right hip arthroplasty.     Action/Plan:   Patient will go to shortterm rehab. Has stated she will go to [facility]. Social worker is aware.   Anticipated DC Date:  06/02/2014   Anticipated DC Plan:  [place]  In-house referral  Clinical Social Worker      DC Planning Services  CM consult      PAC Choice  [place]   Choice offered to / List presented to:  C-1 Patient   DME arranged  Vassie Moselle      DME agency  [place] arranged  HH-2 PT  HH-1 RN      [place]   Status of service:  Completed, signed off Medicare Important Message given?   (If response is "NO", the following Medicare IM given date fields will be blank) Date Medicare IM given:   Medicare IM given by:   Date Additional Medicare IM given:   Additional Medicare IM given by:    Discharge Disposition:  [place]  Per UR Regulation:  Reviewed for med. necessity/level of care/duration of stay  If discussed at [place] of Stay Meetings, dates discussed:    Comments:  06/02/14 CM called [facility] DME rep, james to please deliver the rolling walker to room this am for discharge.  CM notified [facility] rep, [place] of discharge.  No other CM needs were communicated.  Mariane Masters, BSN, [place] [phone].  06/01/14 3:06pm Ricki Miller, RN BSN Case Manager Patient was denied approval for SNF. Case Mnager spoke with her concerning home health and DME needs. Choice was offered.Patient was preoperatively setup with [facility]. . Start of care will be 12/20. Patient states she lives in a ranch  style home and has 3 steps to enter her home. Physical therapy will work with patient on stairs 06/01/14 and again in the AM prior to discharge.

## 2014-06-02 NOTE — Discharge Summary (Addendum)
Physician Discharge Summary  Patient ID: Nichole Frank MRN: [id number] DOB/AGE: 16-Nov-1950 63 y.o.  Admit date: 05/30/2014 Discharge date: 06/02/2014  Admission Diagnoses:  Right hip primary localized osteoarthritis  Discharge Diagnoses:  Active Problems:   Primary localized osteoarthrosis of pelvic region Acute on chronic blood loss anemia  Past Medical History  Diagnosis Date  . Obesity   . IBS (irritable bowel syndrome)   . Hypertension   . Depression   . Anxiety   . Kidney stones     "passed on their own"  . Complication of anesthesia     "I got cold"  . PONV (postoperative nausea and vomiting)   . DVT (deep venous thrombosis) 1993    RLE  . Pneumonia 2012  . Seasonal asthma     "spring and fall"  . Anemia   . History of blood transfusion 2002    "related to knee replacements"  . Migraine     "when I get close to linseed oil products"  . Arthritis     "all over"    Surgeries: Procedure(s): TOTAL HIP ARTHROPLASTY ANTERIOR APPROACH on 05/30/2014   Consultants (if any):    Discharged Condition: Improved  Hospital Course: Nichole Frank is an 63 y.o. female who was admitted 05/30/2014 with a diagnosis of R hip oa and went to the operating room on 05/30/2014 and underwent the above named procedures.    She was given perioperative antibiotics:  Anti-infectives    Start     Dose/Rate Route Frequency Ordered Stop   05/30/14 1430  ceFAZolin (ANCEF) IVPB 2 g/50 mL premix     2 g100 mL/hr over 30 Minutes Intravenous Every 6 hours 05/30/14 1357 05/30/14 2148   05/30/14 0600  ceFAZolin (ANCEF) 3 g in dextrose 5 % 50 mL IVPB     3 g160 mL/hr over 30 Minutes Intravenous On call to O.R. 05/29/14 1357 05/30/14 0854    .  She was given sequential compression devices, early ambulation, and eliquis for DVT prophylaxis.  She benefited maximally from the hospital stay and there were no complications.  She did have postoperative blood loss anemia and was  transfused 2 units of packed red blood cells. She felt much better after the blood transfusion. Her dressings were changed postoperative day 3, and her wounds were clean, just a slight bit of serous drainage.  Recent vital signs:  Filed Vitals:   06/02/14 0556  BP: 111/46  Pulse: 63  Temp: 98.5 F (36.9 C)  Resp: 18    Recent laboratory studies:  Lab Results  Component Value Date   HGB 8.5* 06/02/2014   HGB 8.3* 06/01/2014   HGB 9.1* 05/31/2014   Lab Results  Component Value Date   WBC 7.0 06/02/2014   PLT 193 06/02/2014   Lab Results  Component Value Date   INR 1.05 05/21/2014   Lab Results  Component Value Date   NA 136* 06/02/2014   K 3.8 06/02/2014   CL 102 06/02/2014   CO2 25 06/02/2014   BUN 15 06/02/2014   CREATININE 0.93 06/02/2014   GLUCOSE 89 06/02/2014    Discharge Medications:     Medication List    STOP taking these medications        HYDROcodone-acetaminophen 5-325 MG per tablet  Commonly known as:  NORCO/VICODIN     nabumetone 750 MG tablet  Commonly known as:  RELAFEN     traMADol 50 MG tablet  Commonly known as:  Veatrice Bourbon  TAKE these medications        CALCIUM CITRATE + PO  Take 1,000 mg by mouth daily.     citalopram 10 MG tablet  Commonly known as:  CELEXA  Take 10 mg by mouth daily.     ELIQUIS 2.5 MG Tabs tablet  Generic drug:  apixaban  Take 2.5 mg by mouth 2 (two) times daily.     furosemide 40 MG tablet  Commonly known as:  LASIX  Take 20 mg by mouth 2 (two) times daily.     lisinopril 5 MG tablet  Commonly known as:  PRINIVIL,ZESTRIL  Take 5 mg by mouth daily.     Naltrexone-Bupropion HCl ER 8-90 MG Tb12  Take 2 tablets by mouth 2 (two) times daily.     ondansetron 4 MG tablet  Commonly known as:  ZOFRAN  Take 4 mg by mouth every 8 (eight) hours as needed for nausea or vomiting.     OVER THE COUNTER MEDICATION  Take 1 tablet by mouth at bedtime. SUPPLEMENT: Iron Plus Vitamin C     oxyCODONE-acetaminophen  5-325 MG per tablet  Commonly known as:  ROXICET  Take 1-2 tablets by mouth every 4 (four) hours as needed.     tiZANidine 4 MG capsule  Commonly known as:  ZANAFLEX  Take 4 mg by mouth at bedtime as needed for muscle spasms.     VITAMIN B 12 PO  Take 1 each by mouth every Sunday. Liquid vitamin B12     VITAMIN D PO  Take 1 capsule by mouth daily.        Diagnostic Studies: Dg Hip Portable 1 View Right  05/30/2014   CLINICAL DATA:  Postop radiograph, status post right hip arthroplasty.  EXAM: PORTABLE RIGHT HIP - 1 VIEW  COMPARISON:  Hip MRI, 10/11/2013  FINDINGS: Single AP image shows a right hip prosthesis with the femoral and acetabular components well seated and aligned. No acute fracture or evidence of an operative complication.  IMPRESSION: Well-aligned right hip prosthesis   Electronically Signed   By: Lajean Manes M.D.   On: 05/30/2014 11:42    Disposition:       Discharge Instructions    Call MD / Call 911    Complete by:  As directed   If you experience chest pain or shortness of breath, CALL 911 and be transported to the hospital emergency room.  If you develope a fever above 101 F, pus (white drainage) or increased drainage or redness at the wound, or calf pain, call your surgeon's office.     Change dressing    Complete by:  As directed   Change the dressing daily with sterile 4 x 4 inch gauze dressing and paper tape.  You may clean the incision with alcohol prior to redressing     Constipation Prevention    Complete by:  As directed   Drink plenty of fluids.  Prune juice may be helpful.  You may use a stool softener, such as Colace (over the counter) 100 mg twice a day.  Use MiraLax (over the counter) for constipation as needed.     Diet - low sodium heart healthy    Complete by:  As directed      Discharge instructions    Complete by:  As directed   Weight bearing as tolerated.  Take Eliquis as directed for a total of 12 days after surgery to prevent blood  clots.  Change dressing daily starting on Saturday.  May shower on Monday, but do not soak incision.  May apply ice for up to 20 minutes at a time for pain and swelling.  Follow up appointment in two weeks.     Follow the hip precautions as taught in Physical Therapy    Complete by:  As directed   Posterior total hip precautions.  Weight bearing as tolerated     Increase activity slowly as tolerated    Complete by:  As directed      TED hose    Complete by:  As directed   Use stockings (TED hose) for 2-3 weeks on both leg(s).  You may remove them at night for sleeping.           Follow-up Information    Follow up with [facility] F, MD. Schedule an appointment as soon as possible for a visit in 2 weeks.   Specialty:  Orthopedic Surgery   Contact information:   [place] [address] [phone]       Follow up with [facility].   Why:  Someone from [place] will contact you concerning start date and time for physical therapy.   Contact information:   [place] [address] [phone]        Signed: Johnny Bridge 06/02/2014, 10:12 AM

## 2014-06-02 NOTE — Plan of Care (Signed)
Problem: Consults Goal: Diagnosis- Total Joint Replacement Outcome: Completed/Met Date Met:  06/02/14 Primary Total Hip Right

## 2014-06-06 LAB — POCT I-STAT 4, (NA,K, GLUC, HGB,HCT)
Glucose, Bld: 132 mg/dL — ABNORMAL HIGH (ref 70–99)
HEMATOCRIT: 25 % — AB (ref 36.0–46.0)
Hemoglobin: 8.5 g/dL — ABNORMAL LOW (ref 12.0–15.0)
Potassium: 3.4 mmol/L — ABNORMAL LOW (ref 3.5–5.1)
Sodium: 139 mmol/L (ref 135–145)

## 2016-01-29 IMAGING — CR DG HIP 1V PORT*R*
1 series · 1 of 1 positions shown · non-contrast
Comparison: Hip MRI, 10/11/2013

CLINICAL DATA: Postop radiograph, status post right hip
arthroplasty.

EXAM:
PORTABLE RIGHT HIP - 1 VIEW

[ap]
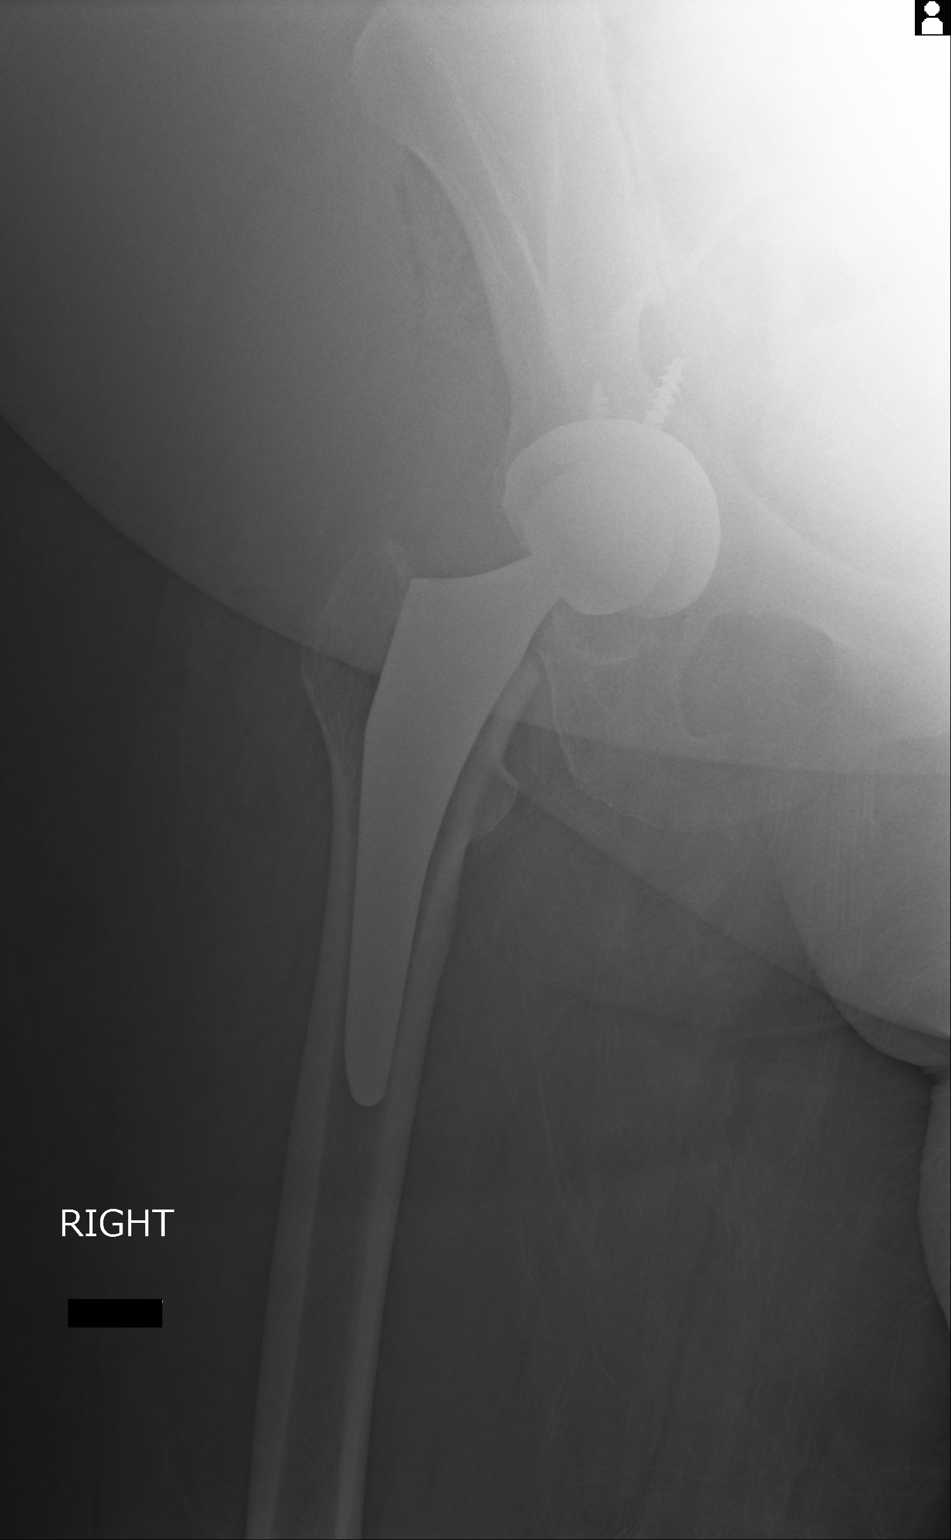

[1 of 1 positions shown; findings below may reference images not displayed]

FINDINGS: Single AP image shows a right hip prosthesis with the femoral and
acetabular components well seated and aligned. No acute fracture or
evidence of an operative complication.
IMPRESSION: Well-aligned right hip prosthesis

## 2016-10-13 ENCOUNTER — Other Ambulatory Visit: Payer: Self-pay | Admitting: Dermatology

## 2019-07-24 ENCOUNTER — Ambulatory Visit: Payer: BC Managed Care – PPO

## 2020-06-15 HISTORY — PX: EYE SURGERY: SHX253

## 2021-08-15 DIAGNOSIS — U071 COVID-19: Secondary | ICD-10-CM

## 2021-08-15 HISTORY — DX: COVID-19: U07.1

## 2021-09-03 ENCOUNTER — Encounter (HOSPITAL_COMMUNITY): Payer: Self-pay | Admitting: Obstetrics and Gynecology

## 2021-09-04 ENCOUNTER — Other Ambulatory Visit: Payer: Self-pay

## 2021-09-04 ENCOUNTER — Encounter (HOSPITAL_COMMUNITY): Payer: Self-pay | Admitting: Obstetrics and Gynecology

## 2021-09-04 NOTE — H&P (Signed)
Nichole Frank is an 71 y.o. postmenopausal G2P0 who is admitted for Hysteroscopy with Dilation and Curettage and possible Myosure Polypectomy for recurrent postmenopausal bleeding. ? ?She had history of PMB 11/2020 and had recent recurrence 07/2021. EMB last year was benign. The bleeding was heavy for one day and resolved. ? ?Work-up: ?Pap smear (11/2020): NILM ?EMB (11/2020): Fragments and strips of inactive endometrium with areas of tubal metaplasia and breakdown changes. Scant fragments of benign endocervical glands, mucus, and blood. No hyperplasia or carcinoma. ? ?TVUS (07/23/2021): Uterus 7.0 x 4.49 x 3.87cm, endometrial thickness 1.02cm. Fibroid 1 2.46cm. Right ovary 2.37cm, Left ovary 2.61cm. anteverted uterus. Uterine fibroid - right pedunculated (calcified) 2.5 x 2.3 x 2.2cm. Endometrium thickened fundally with questionable hyperechoic mass 1.6 x 1.5 x 0.9cm, no blood flow noted. Small amount of free fluid seen within cervix. Bilateral ovaries wnl. No adnexal masses seen. ? ? ? ?Patient Active Problem List  ? Diagnosis Date Noted  ? Primary localized osteoarthrosis of pelvic region 05/30/2014  ? ?MEDICAL/FAMILY/SOCIAL HX: ?No LMP recorded. Patient is postmenopausal. ?  ? ?Past Medical History:  ?Diagnosis Date  ? Anemia   ? Anxiety   ? Arthritis   ? "all over"  ? Complication of anesthesia   ? "I got cold"  ? Depression   ? DVT (deep venous thrombosis) ([place]) 06/16/1991  ? RLE  ? History of blood transfusion 06/15/2000  ? "related to knee replacements"  ? Hypertension   ? IBS (irritable bowel syndrome)   ? Kidney stones   ? "passed on their own"  ? Migraine   ? "when I get close to linseed oil products"  ? Obesity   ? Pneumonia 06/15/2010  ? PONV (postoperative nausea and vomiting)   ? Seasonal asthma   ? "spring and fall"  ? ? ?Past Surgical History:  ?Procedure Laterality Date  ? CARPAL TUNNEL RELEASE Right 1990's  ? CHOLECYSTECTOMY  ~ 2003  ? "nicked my intestines"  ? COLON SURGERY  ~ 2003  ? "wrapped  intestines w/mesh after nicked during chole"  ? ROUX-EN-Y GASTRIC BYPASS  03/2004  ? SHOULDER ARTHROSCOPY W/ ROTATOR CUFF REPAIR Left   ? [place]  ? "tonsils came out once; adenoids twice"  ? TOTAL HIP ARTHROPLASTY Right 05/30/2014  ? TOTAL HIP ARTHROPLASTY Right 05/30/2014  ? Procedure: TOTAL HIP ARTHROPLASTY ANTERIOR APPROACH;  Surgeon: Ninetta Lights, MD;  Location: [facility];  Service: Orthopedics;  Laterality: Right;  ? TOTAL KNEE ARTHROPLASTY Bilateral 2002  ? ? ?Family History  ?Problem Relation Age of Onset  ? Cancer Mother   ?     lymphoma  ? Heart disease Father   ?     heart failure  ? ? ?Social History:  reports that she quit smoking about 29 years ago. Her smoking use included cigarettes. She has a 25.00 pack-year smoking history. She has never used smokeless tobacco. She reports current alcohol use of about 1.0 standard drink per week. She reports that she does not use drugs. ? ?ALLERGIES/MEDS: ? ?Allergies:  ?Allergies  ?Allergen Reactions  ? Adhesive [Tape] Other (See Comments)  ?  AGENT:Glue on Steri Strips ?REACTION: welts  ? Flaxseed (Linseed) Other (See Comments)  ?  REACTION: migraines ? ?Flaxseed oil   ? Grapefruit Diet Or [Extra Strength Grapefruit] Hives  ? Molds & Smuts Other (See Comments)  ?  Upper respiratory problems  ? ? ?No medications prior to admission.  ? ? ? ?Review  of Systems  ?Constitutional: Negative.   ?HENT: Negative.    ?Eyes: Negative.   ?Respiratory: Negative.    ?Cardiovascular: Negative.   ?Gastrointestinal: Negative.   ?Genitourinary: Negative.   ?Musculoskeletal: Negative.   ?Skin: Negative.   ?Neurological: Negative.   ?Endo/Heme/Allergies: Negative.   ?Psychiatric/Behavioral: Negative.    ? ?There were no vitals taken for this visit. ?Gen:  NAD, pleasant and cooperative ?Cardio:  RRR ?Pulm:  CTAB, no wheezes/rales/rhonchi ?Abd:  Soft, non-distended, non-tender throughout, no rebound/guarding ?Ext:  No bilateral LE edema, no bilateral  calf tenderness ? ?No results found for this or any previous visit (from the past 24 hour(s)). ? ?No results found. ? ? ?ASSESSMENT/PLAN: ?Nichole Frank is a 71 y.o. postmenopausal G2P0 who is admitted for Hysteroscopy with Dilation and Curettage and possible Myosure Polypectomy for recurrent postmenopausal bleeding. ? ?- Admit to [facility] Main OR ?- Admit labs (CBC, T&S) ?- Diet:  ERAS pathway/per anesthesia ?- IVF:  Per anesthesia ?- VTE Prophylaxis:  SCDs ?- Antibiotics: None indicated ?- D/C home same-day ? ?Consents: ?I discussed with the patient that this surgery is performed to look inside the uterus and remove the uterine lining.  Prior to surgery, the risks and benefits of the surgery, as well as alternative treatments, have been discussed.  The risks include, but are not limited to bleeding, including the need for a blood transfusion, infection, damage to organs and tissues, including uterine perforation, requiring additional surgery, postoperative pain, short-term and long-term, failure of the procedure to control symptoms, need for hysterectomy to control bleeding, fluid overload, which could create electrolyte abnormalities and the need to stop the procedure before completion, inability to safely complete the procedure, deep vein thrombosis and/or pulmonary embolism, painful intercourse, complications the course of which cannot be predicted or prevented, and death.  ?Patient was consented for blood products.  The patient is aware that bleeding may result in the need for a blood transfusion which includes risk of transmission of HIV (1:2 million), Hepatitis C (1:2 million), and Hepatitis B (1:200 thousand) and transfusion reaction.  Patient voiced understanding of the above risks as well as understanding of indications for blood transfusion. ? ? ?[name], DO ?[phone] (office) ? ?  ?

## 2021-09-04 NOTE — Progress Notes (Addendum)
Spoke with pt for pre-op call. Pt states she does not have a cardiac history. Pt is treated for HTN, but is not diabetic.  ? ?Shower instructions given to pt. ? ?

## 2021-09-08 ENCOUNTER — Encounter (HOSPITAL_COMMUNITY): Payer: Self-pay | Admitting: Obstetrics and Gynecology

## 2021-09-08 ENCOUNTER — Other Ambulatory Visit: Payer: Self-pay

## 2021-09-08 ENCOUNTER — Ambulatory Visit (HOSPITAL_COMMUNITY): Payer: BC Managed Care – PPO | Admitting: Anesthesiology

## 2021-09-08 ENCOUNTER — Encounter (HOSPITAL_COMMUNITY)
Admission: RE | Disposition: A | Discharge status: Home / Self Care | Payer: Self-pay | Source: Home / Self Care | Attending: Obstetrics and Gynecology

## 2021-09-08 ENCOUNTER — Ambulatory Visit (HOSPITAL_COMMUNITY)
Admission: RE | Admit: 2021-09-08 | Discharge: 2021-09-08 | Disposition: A | Discharge status: Home / Self Care | Payer: BC Managed Care – PPO | Attending: Obstetrics and Gynecology | Admitting: Obstetrics and Gynecology

## 2021-09-08 DIAGNOSIS — N840 Polyp of corpus uteri: Secondary | ICD-10-CM | POA: Diagnosis not present

## 2021-09-08 DIAGNOSIS — N8501 Benign endometrial hyperplasia: Secondary | ICD-10-CM | POA: Diagnosis not present

## 2021-09-08 DIAGNOSIS — N950 Postmenopausal bleeding: Secondary | ICD-10-CM | POA: Diagnosis not present

## 2021-09-08 DIAGNOSIS — X58XXXA Exposure to other specified factors, initial encounter: Secondary | ICD-10-CM | POA: Diagnosis not present

## 2021-09-08 DIAGNOSIS — R9389 Abnormal findings on diagnostic imaging of other specified body structures: Secondary | ICD-10-CM | POA: Diagnosis not present

## 2021-09-08 DIAGNOSIS — Z87891 Personal history of nicotine dependence: Secondary | ICD-10-CM | POA: Insufficient documentation

## 2021-09-08 DIAGNOSIS — Z780 Asymptomatic menopausal state: Secondary | ICD-10-CM | POA: Insufficient documentation

## 2021-09-08 DIAGNOSIS — S3141XA Laceration without foreign body of vagina and vulva, initial encounter: Secondary | ICD-10-CM | POA: Diagnosis not present

## 2021-09-08 HISTORY — DX: Malignant (primary) neoplasm, unspecified: C80.1

## 2021-09-08 HISTORY — DX: Personal history of urinary calculi: Z87.442

## 2021-09-08 HISTORY — PX: DILATATION & CURETTAGE/HYSTEROSCOPY WITH MYOSURE: SHX6511

## 2021-09-08 LAB — CBC
HCT: 36.2 % (ref 36.0–46.0)
Hemoglobin: 11.8 g/dL — ABNORMAL LOW (ref 12.0–15.0)
MCH: 31.1 pg (ref 26.0–34.0)
MCHC: 32.6 g/dL (ref 30.0–36.0)
MCV: 95.5 fL (ref 80.0–100.0)
Platelets: 221 10*3/uL (ref 150–400)
RBC: 3.79 MIL/uL — ABNORMAL LOW (ref 3.87–5.11)
RDW: 13.3 % (ref 11.5–15.5)
WBC: 4.8 10*3/uL (ref 4.0–10.5)
nRBC: 0 % (ref 0.0–0.2)

## 2021-09-08 LAB — BASIC METABOLIC PANEL
Anion gap: 5 (ref 5–15)
BUN: 12 mg/dL (ref 8–23)
CO2: 25 mmol/L (ref 22–32)
Calcium: 8 mg/dL — ABNORMAL LOW (ref 8.9–10.3)
Chloride: 110 mmol/L (ref 98–111)
Creatinine, Ser: 0.86 mg/dL (ref 0.44–1.00)
GFR, Estimated: >60 mL/min (ref >60)
Glucose, Bld: 87 mg/dL (ref 70–99)
Potassium: 3.7 mmol/L (ref 3.5–5.1)
Sodium: 140 mmol/L (ref 135–145)

## 2021-09-08 LAB — TYPE AND SCREEN
ABO/RH(D): O POS
Antibody Screen: NEGATIVE

## 2021-09-08 SURGERY — DILATATION & CURETTAGE/HYSTEROSCOPY WITH MYOSURE
Anesthesia: General | Site: Uterus

## 2021-09-08 MED ORDER — PROPOFOL 10 MG/ML IV BOLUS
INTRAVENOUS | Status: DC | PRN
Start: 2021-09-08 — End: 2021-09-08
  Administered 2021-09-08: 200 mg via INTRAVENOUS

## 2021-09-08 MED ORDER — SILVER NITRATE-POT NITRATE 75-25 % EX MISC
CUTANEOUS | Status: DC | PRN
  Administered 2021-09-08: 3
  Administered 2021-09-08: 1 via TOPICAL

## 2021-09-08 MED ORDER — PHENYLEPHRINE HCL (PRESSORS) 10 MG/ML IV SOLN
INTRAVENOUS | Status: DC | PRN
Start: 2021-09-08 — End: 2021-09-08
  Administered 2021-09-08: 80 ug via INTRAVENOUS

## 2021-09-08 MED ORDER — SCOPOLAMINE 1 MG/3DAYS TD PT72
1.0000 | MEDICATED_PATCH | TRANSDERMAL | Status: DC
  Administered 2021-09-08: 1.5 mg via TRANSDERMAL
  Filled 2021-09-08: qty 1

## 2021-09-08 MED ORDER — SODIUM CHLORIDE 0.9 % IR SOLN
Status: DC | PRN
  Administered 2021-09-08: 3000 mL

## 2021-09-08 MED ORDER — BACITRACIN ZINC 500 UNIT/GM EX OINT
TOPICAL_OINTMENT | CUTANEOUS | Status: AC
  Filled 2021-09-08: qty 28.35

## 2021-09-08 MED ORDER — PROPOFOL 10 MG/ML IV BOLUS
INTRAVENOUS | Status: AC
  Filled 2021-09-08: qty 20

## 2021-09-08 MED ORDER — ACETAMINOPHEN 500 MG PO TABS
1000.0000 mg | ORAL_TABLET | Freq: Once | ORAL | Status: AC
  Administered 2021-09-08: 1000 mg via ORAL
  Filled 2021-09-08: qty 2

## 2021-09-08 MED ORDER — EPHEDRINE SULFATE-NACL 50-0.9 MG/10ML-% IV SOSY
PREFILLED_SYRINGE | INTRAVENOUS | Status: DC | PRN
Start: 2021-09-08 — End: 2021-09-08
  Administered 2021-09-08 (×2): 10 mg via INTRAVENOUS

## 2021-09-08 MED ORDER — FENTANYL CITRATE (PF) 250 MCG/5ML IJ SOLN
INTRAMUSCULAR | Status: DC | PRN
  Administered 2021-09-08: 50 ug via INTRAVENOUS

## 2021-09-08 MED ORDER — ONDANSETRON HCL 4 MG/2ML IJ SOLN
INTRAMUSCULAR | Status: DC | PRN
Start: 2021-09-08 — End: 2021-09-08
  Administered 2021-09-08: 4 mg via INTRAVENOUS

## 2021-09-08 MED ORDER — MEPERIDINE HCL 25 MG/ML IJ SOLN: 6.2500 mg | INTRAMUSCULAR | Status: DC | PRN

## 2021-09-08 MED ORDER — CHLORHEXIDINE GLUCONATE 0.12 % MT SOLN
15.0000 mL | Freq: Once | OROMUCOSAL | Status: AC
  Administered 2021-09-08: 15 mL via OROMUCOSAL
  Filled 2021-09-08: qty 15

## 2021-09-08 MED ORDER — HYDROMORPHONE HCL 1 MG/ML IJ SOLN: 0.2500 mg | INTRAMUSCULAR | Status: DC | PRN

## 2021-09-08 MED ORDER — FENTANYL CITRATE (PF) 250 MCG/5ML IJ SOLN
INTRAMUSCULAR | Status: AC
  Filled 2021-09-08: qty 5

## 2021-09-08 MED ORDER — OXYCODONE HCL 5 MG/5ML PO SOLN: 5.0000 mg | Freq: Once | ORAL | Status: DC | PRN

## 2021-09-08 MED ORDER — LIDOCAINE 2% (20 MG/ML) 5 ML SYRINGE
INTRAMUSCULAR | Status: DC | PRN
  Administered 2021-09-08: 60 mg via INTRAVENOUS

## 2021-09-08 MED ORDER — KETOROLAC TROMETHAMINE 30 MG/ML IJ SOLN
INTRAMUSCULAR | Status: DC | PRN
  Administered 2021-09-08: 30 mg via INTRAVENOUS

## 2021-09-08 MED ORDER — OXYCODONE HCL 5 MG PO TABS: 5.0000 mg | ORAL_TABLET | Freq: Once | ORAL | Status: DC | PRN

## 2021-09-08 MED ORDER — KETOROLAC TROMETHAMINE 30 MG/ML IJ SOLN: 30.0000 mg | Freq: Once | INTRAMUSCULAR | Status: DC | PRN

## 2021-09-08 MED ORDER — DEXAMETHASONE SODIUM PHOSPHATE 10 MG/ML IJ SOLN
INTRAMUSCULAR | Status: DC | PRN
  Administered 2021-09-08: 5 mg via INTRAVENOUS

## 2021-09-08 MED ORDER — LACTATED RINGERS IV SOLN: INTRAVENOUS | Status: DC

## 2021-09-08 MED ORDER — MIDAZOLAM HCL 2 MG/2ML IJ SOLN
INTRAMUSCULAR | Status: AC
  Filled 2021-09-08: qty 2

## 2021-09-08 MED ORDER — AMISULPRIDE (ANTIEMETIC) 5 MG/2ML IV SOLN: 10.0000 mg | Freq: Once | INTRAVENOUS | Status: DC | PRN

## 2021-09-08 MED ORDER — ONDANSETRON HCL 4 MG/2ML IJ SOLN: 4.0000 mg | Freq: Once | INTRAMUSCULAR | Status: DC | PRN

## 2021-09-08 MED ORDER — ORAL CARE MOUTH RINSE: 15.0000 mL | Freq: Once | OROMUCOSAL | Status: AC

## 2021-09-08 SURGICAL SUPPLY — 20 items
CANISTER SUCT 3000ML PPV (MISCELLANEOUS) ×2 IMPLANT
CATH ROBINSON RED A/P 16FR (CATHETERS) ×2 IMPLANT
CNTNR URN SCR LID CUP LEK RST (MISCELLANEOUS) IMPLANT
CONT SPEC 4OZ STRL OR WHT (MISCELLANEOUS) ×2
DEVICE MYOSURE LITE (MISCELLANEOUS) IMPLANT
DEVICE MYOSURE REACH (MISCELLANEOUS) ×1 IMPLANT
DILATOR CANAL MILEX (MISCELLANEOUS) IMPLANT
GLOVE SURG ENC MOIS LTX SZ6.5 (GLOVE) ×2 IMPLANT
GLOVE SURG ENC TEXT LTX SZ6.5 (GLOVE) ×2 IMPLANT
GLOVE SURG UNDER POLY LF SZ7 (GLOVE) ×2 IMPLANT
GOWN STRL REUS W/ TWL LRG LVL3 (GOWN DISPOSABLE) ×2 IMPLANT
GOWN STRL REUS W/TWL LRG LVL3 (GOWN DISPOSABLE) ×4
KIT PROCEDURE FLUENT (KITS) ×2 IMPLANT
KIT TURNOVER KIT B (KITS) ×2 IMPLANT
PACK VAGINAL MINOR WOMEN LF (CUSTOM PROCEDURE TRAY) ×2 IMPLANT
PAD OB MATERNITY 4.3X12.25 (PERSONAL CARE ITEMS) ×2 IMPLANT
SEAL ROD LENS SCOPE MYOSURE (ABLATOR) ×2 IMPLANT
SUT VIC AB 3-0 CT1 36 (SUTURE) ×1 IMPLANT
TOWEL GREEN STERILE FF (TOWEL DISPOSABLE) ×4 IMPLANT
UNDERPAD 30X36 HEAVY ABSORB (UNDERPADS AND DIAPERS) ×2 IMPLANT

## 2021-09-08 NOTE — OR Nursing (Signed)
Silver Nitrate given under verbal order of Dr. Delora Fuel for skin tear under pannus. Skin tear was present preoperatively. Silver Nitrate applied to pannus by this RN to stop bleeding of skin tear. Patient tolerated. ?

## 2021-09-08 NOTE — Transfer of Care (Signed)
Immediate Anesthesia Transfer of Care Note ? ?Patient: Nichole Frank ? ?Procedure(s) Performed: DILATATION & CURETTAGE/HYSTEROSCOPY WITH MYOSURE (Uterus) ? ?Patient Location: PACU ? ?Anesthesia Type:General ? ?Level of Consciousness: drowsy ? ?Airway & Oxygen Therapy: Patient Spontanous Breathing and Patient connected to nasal cannula oxygen ? ?Post-op Assessment: Report given to RN and Post -op Vital signs reviewed and stable ? ?Post vital signs: Reviewed and stable ? ?Last Vitals:  ?Vitals Value Taken Time  ?BP 163/73 09/08/21 1327  ?Temp    ?Pulse 49 09/08/21 1331  ?Resp 15 09/08/21 1331  ?SpO2 99 % 09/08/21 1331  ?Vitals shown include unvalidated device data. ? ?Last Pain:  ?Vitals:  ? 09/08/21 1018  ?TempSrc:   ?PainSc: 0-No pain  ?   ? ?Patients Stated Pain Goal: 0 (09/08/21 1018) ? ?Complications: No notable events documented. ?

## 2021-09-08 NOTE — Anesthesia Procedure Notes (Signed)
Procedure Name: LMA Insertion ?Date/Time: 09/08/2021 12:38 PM ?Performed by: Imagene Riches, CRNA ?Pre-anesthesia Checklist: Patient identified, Emergency Drugs available, Suction available and Patient being monitored ?Patient Re-evaluated:Patient Re-evaluated prior to induction ?Oxygen Delivery Method: Circle System Utilized ?Preoxygenation: Pre-oxygenation with 100% oxygen ?Induction Type: IV induction ?Ventilation: Mask ventilation without difficulty ?LMA: LMA inserted ?LMA Size: 4.0 ?Number of attempts: 1 ?Airway Equipment and Method: Bite block ?Placement Confirmation: positive ETCO2 ?Tube secured with: Tape ?Dental Injury: Teeth and Oropharynx as per pre-operative assessment  ? ? ? ? ?

## 2021-09-08 NOTE — Interval H&P Note (Signed)
History and Physical Interval Note: ? ?09/08/2021 ?12:20 PM ? ?Nichole Frank  has presented today for surgery, with the diagnosis of POSTMENOPAUSAL BLEEDING.  The various methods of treatment have been discussed with the patient and family. After consideration of risks, benefits and other options for treatment, the patient has consented to  Procedure(s) with comments: ?DILATATION & CURETTAGE/HYSTEROSCOPY WITH MYOSURE (N/A) - rep to be here.  Confirmed on 03/20 CS as a surgical intervention.  The patient's history has been reviewed, patient examined, no change in status, stable for surgery.  I have reviewed the patient's chart and labs.  Questions were answered to the patient's satisfaction.   ? ? ?[name] ? ? ?

## 2021-09-08 NOTE — Op Note (Signed)
Pre Op Dx:   ?1. Recurrent postmenopausal bleeding ?2. Thickened endometrium on ultrasound (1.02cm) ? ?Post Op Dx:   ?1. Recurrent postmenopausal bleeding ?2. Thickened endometrium on ultrasound (1.02cm) ?3. Endometrial polyp ? ?Procedure:   ?Hysteroscopy with Dilation and Curettage with Myosure Polypectomy ?  ?Surgeon:  Dr. [name] ?Assistants:  None ?Anesthesia:  General ?  ?EBL:  5cc  ?IVF:  800cc ?UOP:  Voided prior to arrival to OR ?Fluid Deficit:  105cc ?  ?Drains:  None ?Specimen removed:  Endometrial curettings and endometrial polyp - sent to pathology ?Device(s) implanted: None ?Case Type:  Clean-contaminated ?Findings:  Normal-appearing cervix and endometrium. Large polyp with stalk at the fundus. Bilateral tubal ostia not clearly visualized. ?Complications: None ? ?Indications:  71 y.o. postmenopausal G2P0 with recurrent postmenopausal bleeding and thickened endometrium on ultrasound measuring 1.02cm. ? ?Description of each procedure:  After informed consent was obtained the patient was taken to the operating room in the dorsal supine position.  After administration of general anesthesia, the patient was placed in the dorsal lithotomy position and prepped and draped in the usual sterile fashion. A pre-operative time-out was completed.  The anterior lip of the cervix was grasped with a single-tooth tenaculum and the cervix was serially dilated to accommodate the hysteroscope.  The hysteroscope was advanced and the findings as above was noted. The Myosure Reach was used to resect the polyp and sample the endometrium. A sharp banjo curette was used to curettage the endometrium. The single-tooth tenaculum was removed and its sites were made hemostatic with silver nitrate. A superficial right-sided vaginal laceration was noted and repaired using 3-0 Vicryl. There were other superficial lacerations on the bilateral labia made hemostatic with pressure and silver nitrate.  Adequate hemostasis was noted.   The patient was awakened and extubated and appeared to have tolerated the procedure well.  All counts were correct. ?Disposition:  PACU ? ?[name], DO ? ?

## 2021-09-08 NOTE — Discharge Instructions (Addendum)
Please take Aleve as needed as you mentioned you have this medication at home. ?

## 2021-09-08 NOTE — Anesthesia Preprocedure Evaluation (Addendum)
Anesthesia Evaluation  ?Patient identified by MRN, date of birth, ID band ?Patient awake ? ? ? ?Reviewed: ?Allergy & Precautions, NPO status , Patient's Chart, lab work & pertinent test results ? ?History of Anesthesia Complications ?(+) PONV and history of anesthetic complications ? ?Airway ?Mallampati: III ? ?TM Distance: >3 FB ?Neck ROM: Full ? ? ? Dental ? ?(+) Teeth Intact, Dental Advisory Given ?  ?Pulmonary ?asthma (only uses inhaler when sick) , former smoker,  ?Quit smoking 1993, 25 pack year history  ?Had sleep study and CPAP pre-gastric bypass (18 years ago), hasnt used since gastric bypass. Unsure if she still snores ?  ?Pulmonary exam normal ?breath sounds clear to auscultation ? ? ? ? ? ? Cardiovascular ?hypertension (175/45 in preop, pt does not take her BP at home but in office is usually 160s), Pt. on medications ?+ DVT (RLE 1993)  ?Normal cardiovascular exam ?Rhythm:Regular Rate:Normal ? ? ?  ?Neuro/Psych ? Headaches, PSYCHIATRIC DISORDERS Anxiety Depression   ? GI/Hepatic ?Neg liver ROS, Gastric bypass 18 years ago ?  ?Endo/Other  ?Morbid obesityBMI 49 ? Renal/GU ?negative Renal ROS  ?negative genitourinary ?  ?Musculoskeletal ? ?(+) Arthritis , Osteoarthritis,   ? Abdominal ?(+) + obese,   ?Peds ? Hematology ?negative hematology ROS ?(+)   ?Anesthesia Other Findings ? ? Reproductive/Obstetrics ?Postmenopausal bleeding  ? ?  ? ? ? ? ? ? ? ? ? ? ? ? ? ?  ?  ? ? ? ? ? ? ? ?Anesthesia Physical ?Anesthesia Plan ? ?ASA: 3 ? ?Anesthesia Plan: General  ? ?Post-op Pain Management: Tylenol PO (pre-op)*  ? ?Induction: Intravenous ? ?PONV Risk Score and Plan: Ondansetron, Dexamethasone, Midazolam, Treatment may vary due to age or medical condition and Scopolamine patch - Pre-op ? ?Airway Management Planned: LMA ? ?Additional Equipment: None ? ?Intra-op Plan:  ? ?Post-operative Plan: Extubation in OR ? ?Informed Consent: I have reviewed the patients History and Physical,  chart, labs and discussed the procedure including the risks, benefits and alternatives for the proposed anesthesia with the patient or authorized representative who has indicated his/her understanding and acceptance.  ? ? ? ?Dental advisory given ? ?Plan Discussed with: CRNA ? ?Anesthesia Plan Comments:   ? ? ? ? ? ?Anesthesia Quick Evaluation ? ?

## 2021-09-09 ENCOUNTER — Encounter (HOSPITAL_COMMUNITY): Payer: Self-pay | Admitting: Obstetrics and Gynecology

## 2021-09-09 LAB — SURGICAL PATHOLOGY

## 2021-09-09 NOTE — Anesthesia Postprocedure Evaluation (Signed)
Anesthesia Post Note ? ?Patient: Nichole Frank ? ?Procedure(s) Performed: DILATATION & CURETTAGE/HYSTEROSCOPY WITH MYOSURE (Uterus) ? ?  ? ?Patient location during evaluation: PACU ?Anesthesia Type: General ?Level of consciousness: awake ?Pain management: pain level controlled ?Vital Signs Assessment: post-procedure vital signs reviewed and stable ?Respiratory status: spontaneous breathing, nonlabored ventilation, respiratory function stable and patient connected to nasal cannula oxygen ?Cardiovascular status: blood pressure returned to baseline and stable ?Postop Assessment: no apparent nausea or vomiting ?Anesthetic complications: no ? ? ?No notable events documented. ? ?Last Vitals:  ?Vitals:  ? 09/08/21 1345 09/08/21 1400  ?BP: (!) 152/61 (!) 163/68  ?Pulse: (!) 52 (!) 50  ?Resp: (!) 21 15  ?Temp:  36.7 ?C  ?SpO2: 100% 97%  ?  ?Last Pain:  ?Vitals:  ? 09/08/21 1330  ?TempSrc:   ?PainSc: 0-No pain  ? ? ?  ?  ?  ?  ?  ?  ? ?Sarp Vernier P Zohra Clavel ? ? ? ? ?

## 2021-12-09 NOTE — H&P (Signed)
Nichole Frank is an 71 y.o. postmenopausal G2P0 who is admitted for Robotic Assisted Total Laparoscopic Hysterectomy with Bilateral Salpingo-oophorectomy for simple and complex hyperplasia without atypia.  She had history of PMB 11/2020 and had recent recurrence 07/2021. EMB last year was benign. The bleeding was heavy for one day and resolved. She had Hysteroscopy D&C on 09/08/21 due to the recurrence of bleeding and thickened endometrium on [place] measuring 1.02cm. Pathology revealed simple and complex hyperplasia without atypia. Patient declined medical management and desires definitive surgical management via hysterectomy.  Work-up: Surgical Pathology (D&C on 09/08/21): Simple and complex hyperplasia without atypia Changes compatible with benign endometrial polyp Abundant benign ectocervical squamous epithelium and scant benign endocervical epithelium Negative for breakdown and carcinoma  Pap smear (11/2020): NILM  EMB (11/2020): Fragments and strips of inactive endometrium with areas of tubal metaplasia and breakdown changes. Scant fragments of benign endocervical glands, mucus, and blood. No hyperplasia or carcinoma.   TVUS (07/23/2021): Uterus 7.0 x 4.49 x 3.87cm, endometrial thickness 1.02cm. Fibroid 1 2.46cm. Right ovary 2.37cm, Left ovary 2.61cm. anteverted uterus. Uterine fibroid - right pedunculated (calcified) 2.5 x 2.3 x 2.2cm. Endometrium thickened fundally with questionable hyperechoic mass 1.6 x 1.5 x 0.9cm, no blood flow noted. Small amount of free fluid seen within cervix. Bilateral ovaries wnl. No adnexal masses seen.  Patient Active Problem List   Diagnosis Date Noted   Anemia 12/21/2021   Anxiety disorder 12/21/2021   Allergic rhinitis 12/21/2021   Abnormal findings on diagnostic imaging of other specified body structures 12/21/2021   Bariatric surgery status 12/21/2021   Diverticular disease of colon 12/21/2021   Endometrial hyperplasia without atypia 12/21/2021   Essential  hypertension 12/21/2021   Localized edema 12/21/2021   Primary localized osteoarthrosis of pelvic region 05/30/2014   MEDICAL/FAMILY/SOCIAL HX: No LMP recorded. Patient is postmenopausal.    Past Medical History:  Diagnosis Date   Anemia    Anxiety    Arthritis    "all over"   Cancer [facility])    melanoma - abdomen   Complication of anesthesia    "I got cold"   COVID 08/15/2021   fever and mild achiness   Depression    DVT (deep venous thrombosis) ([place]) 06/16/1991   RLE   History of blood transfusion 06/15/2000   "related to knee replacements"   History of kidney stones    Hypertension    IBS (irritable bowel syndrome)    Migraine    "when I get close to linseed oil products"   Obesity    Pneumonia 06/15/2010   PONV (postoperative nausea and vomiting)    Seasonal asthma    "spring and fall"    Past Surgical History:  Procedure Laterality Date   CARPAL TUNNEL RELEASE Right 02/13/1989   CHOLECYSTECTOMY  ~ 2003   "nicked my intestines"   COLON SURGERY  ~ 2003   "wrapped intestines w/mesh after nicked during chole"   [place] N/A 09/08/2021   Procedure: [place];  Surgeon: [name], DO;  Location: [facility];  Service: Gynecology;  Laterality: N/A;   EYE SURGERY Bilateral 2022   ROUX-EN-Y GASTRIC BYPASS  03/15/2004   SHOULDER ARTHROSCOPY W/ ROTATOR CUFF REPAIR Left 2007   TONSILLECTOMY AND [place]   "tonsils came out once; adenoids twice"   TOTAL HIP ARTHROPLASTY Right 05/30/2014   TOTAL HIP ARTHROPLASTY Right 05/30/2014   Procedure: TOTAL HIP ARTHROPLASTY ANTERIOR APPROACH;  Surgeon: Ninetta Lights, MD;  Location: [facility];  Service: Orthopedics;  Laterality: Right;   TOTAL KNEE ARTHROPLASTY Bilateral 2003   one knee in May, the other in July    Family History  Problem Relation Age of Onset   Cancer Mother        lymphoma   Heart disease Father        heart  failure    Social History:  reports that she quit smoking about 29 years ago. Her smoking use included cigarettes. She has a 25.00 pack-year smoking history. She has never used smokeless tobacco. She reports current alcohol use of about 1.0 standard drink of alcohol per week. She reports that she does not use drugs.  ALLERGIES/MEDS:  Allergies:  Allergies  Allergen Reactions   Adhesive [Tape] Other (See Comments)    AGENT:Glue on Steri Strips REACTION: welts   Flaxseed (Linseed) Other (See Comments)    REACTION: migraines  Flaxseed oil    Grapefruit Diet Or [Extra Strength Grapefruit] Hives   Molds & Smuts Other (See Comments)    Upper respiratory problems    No medications prior to admission.     Review of Systems  Constitutional: Negative.   HENT: Negative.    Eyes: Negative.   Respiratory: Negative.    Cardiovascular: Negative.   Gastrointestinal: Negative.   Genitourinary:  Negative for dysuria.  Musculoskeletal: Negative.   Skin: Negative.   Neurological: Negative.   Endo/Heme/Allergies: Negative.   Psychiatric/Behavioral: Negative.      There were no vitals taken for this visit. Gen:  NAD, pleasant and cooperative Cardio:  RRR Pulm:  CTAB, no wheezes/rales/rhonchi Abd:  Soft, non-distended, non-tender throughout, no rebound/guarding Ext:  No bilateral LE edema, no bilateral calf tenderness Pelvic: Labia - unremarkable, vagina - pink moist mucosa, no lesions or abnormal discharge, cervix - no discharge or lesions or CMT, adnexa - no masses or tenderness - uterus non-tender and normal size on palpation, exam limited by body habitus  No results found for this or any previous visit (from the past 24 hour(s)).  No results found.   ASSESSMENT/PLAN: Nichole Frank is a 71 y.o. postmenopausal G2P0 who is admitted for Robotic Assisted Total Laparoscopic Hysterectomy with Bilateral Salpingo-oophorectomy for simple and complex hyperplasia without atypia.  -  Admit to [place] labs (CBC, CMP, T&S, COVID screen per protocol) - Admit imaging: EKG/CXR per anesthesia - Diet:  NPO - IVF:  Per anesthesia - VTE Prophylaxis:  SCDs - Antibiotics: Ancef 3g on call to OR - D/C home likely POD#1  History of DVT noted on problem list in Epic - this is not listed in her medical history at [facility] or discussed. Caprini VTE score is 8 points (high risk) - recommend pneumatic compression devices AND LMWH for at least 7-10 days post-operatively. Will discuss prior to surgery.  Consents: I have explained to the patient that his surgery is performed to remove the uterus through several small incisions in the abdomen and that it will result in sterility.  I discussed the risks and benefits of the surgery, including, but not limited to bleeding, including the need for blood transfusion, infection, damage to surround organs and tissues, damage to bladder, damage to ureters, causing kidney damage, and requiring additional procedures, damage to bowels, resulting in further surgery, postoperative pain, short-term and long-term, scarring on the abdominal wall and intra-abdominally, need for further surgery, need for conversion to an open procedure, development of an incisional hernia, deep vein thrombosis, and/or pulmonary embolism, wound  infection and/or separation, painful intercourse, urinary leakage,  fistula formation, complications the course of which cannot be predicted or prevented, and death. Patient was consented for blood products.  The patient is aware that bleeding may result in the need for a blood transfusion which includes risk of transmission of HIV (1:2 million), Hepatitis C (1:2 million), and Hepatitis B (1:200 thousand) and transfusion reaction.  Patient voiced understanding of the above risks as well as understanding of indications for blood transfusion.   [name], DO [phone] (office)

## 2021-12-15 NOTE — Pre-Procedure Instructions (Signed)
Surgical Instructions    Your procedure is scheduled on Wednesday, July 12th.  Report to [facility] Main Entrance "A" at 06:30 A.M., then check in with the Admitting office.  Call this number if you have problems the morning of surgery:  [phone]   If you have any questions prior to your surgery date call [phone]: Open Monday-Friday 8am-4pm    Remember:  Do not eat or drink after midnight the night before your surgery     Take these medicines the morning of surgery with A SIP OF WATER  citalopram (CELEXA) Polyethyl Glyc-Propyl Glyc PF (SYSTANE PRESERVATIVE FREE)- if needed   As of today, STOP taking any Aspirin (unless otherwise instructed by your surgeon) Aleve, Naproxen, Ibuprofen, Motrin, Advil, Goody's, BC's, all herbal medications, fish oil, and all vitamins.                     Do NOT Smoke (Tobacco/Vaping) for 24 hours prior to your procedure.  If you use a CPAP at night, you may bring your mask/headgear for your overnight stay.   Contacts, glasses, piercing's, hearing aid's, dentures or partials may not be worn into surgery, please bring cases for these belongings.    For patients admitted to the hospital, discharge time will be determined by your treatment team.   Patients discharged the day of surgery will not be allowed to drive home, and someone needs to stay with them for 24 hours.  SURGICAL WAITING ROOM VISITATION Patients having surgery or a procedure may have two support people in the waiting room. These visitors may be switched out with other visitors if needed. Children under the age of 35 must have an adult accompany them who is not the patient. If the patient needs to stay at the hospital during part of their recovery, the visitor guidelines for inpatient rooms apply.  Please refer to the [facility] website for the visitor guidelines for Inpatients (after your surgery is over and you are in a regular room).    Special instructions:   [place]-  Preparing For Surgery  Before surgery, you can play an important role. Because skin is not sterile, your skin needs to be as free of germs as possible. You can reduce the number of germs on your skin by washing with CHG (chlorahexidine gluconate) Soap before surgery.  CHG is an antiseptic cleaner which kills germs and bonds with the skin to continue killing germs even after washing.    Oral Hygiene is also important to reduce your risk of infection.  Remember - BRUSH YOUR TEETH THE MORNING OF SURGERY WITH YOUR REGULAR TOOTHPASTE  Please do not use if you have an allergy to CHG or antibacterial soaps. If your skin becomes reddened/irritated stop using the CHG.  Do not shave (including legs and underarms) for at least 48 hours prior to first CHG shower. It is OK to shave your face.  Please follow these instructions carefully.   Shower the NIGHT BEFORE SURGERY and the MORNING OF SURGERY  If you chose to wash your hair, wash your hair first as usual with your normal shampoo.  After you shampoo, rinse your hair and body thoroughly to remove the shampoo.  Use CHG Soap as you would any other liquid soap. You can apply CHG directly to the skin and wash gently with a scrungie or a clean washcloth.   Apply the CHG Soap to your body ONLY FROM THE NECK DOWN.  Do not use on open wounds or  open sores. Avoid contact with your eyes, ears, mouth and genitals (private parts). Wash Face and genitals (private parts)  with your normal soap.   Wash thoroughly, paying special attention to the area where your surgery will be performed.  Thoroughly rinse your body with warm water from the neck down.  DO NOT shower/wash with your normal soap after using and rinsing off the CHG Soap.  Pat yourself dry with a CLEAN TOWEL.  Wear CLEAN PAJAMAS to bed the night before surgery  Place CLEAN SHEETS on your bed the night before your surgery  DO NOT SLEEP WITH PETS.   Day of Surgery: Take a shower with CHG  soap. Do not wear jewelry or makeup Do not wear lotions, powders, perfumes, or deodorant. Do not shave 48 hours prior to surgery.   Do not bring valuables to the hospital.  [facility] is not responsible for any belongings or valuables. Do not wear nail polish, gel polish, artificial nails, or any other type of covering on natural nails (fingers and toes) If you have artificial nails or gel coating that need to be removed by a nail salon, please have this removed prior to surgery. Artificial nails or gel coating may interfere with anesthesia's ability to adequately monitor your vital signs. Wear Clean/Comfortable clothing the morning of surgery Remember to brush your teeth WITH YOUR REGULAR TOOTHPASTE.   Please read over the following fact sheets that you were given.    If you received a COVID test during your pre-op visit  it is requested that you wear a mask when out in public, stay away from anyone that may not be feeling well and notify your surgeon if you develop symptoms. If you have been in contact with anyone that has tested positive in the last 10 days please notify you surgeon.

## 2021-12-17 ENCOUNTER — Encounter (HOSPITAL_COMMUNITY): Payer: Self-pay

## 2021-12-17 ENCOUNTER — Encounter (HOSPITAL_COMMUNITY)
Admission: RE | Admit: 2021-12-17 | Discharge: 2021-12-17 | Disposition: A | Discharge status: Home / Self Care | Payer: BC Managed Care – PPO | Source: Ambulatory Visit | Attending: Obstetrics and Gynecology | Admitting: Obstetrics and Gynecology

## 2021-12-17 ENCOUNTER — Other Ambulatory Visit: Payer: Self-pay

## 2021-12-17 VITALS — BP 144/52 | HR 59 | Temp 97.7°F | Resp 19 | Ht 67.0 in | Wt 323.0 lb

## 2021-12-17 DIAGNOSIS — N8500 Endometrial hyperplasia, unspecified: Secondary | ICD-10-CM | POA: Diagnosis not present

## 2021-12-17 DIAGNOSIS — Z01812 Encounter for preprocedural laboratory examination: Secondary | ICD-10-CM | POA: Insufficient documentation

## 2021-12-17 DIAGNOSIS — I10 Essential (primary) hypertension: Secondary | ICD-10-CM | POA: Diagnosis not present

## 2021-12-17 LAB — BASIC METABOLIC PANEL
Anion gap: 8 (ref 5–15)
BUN: 19 mg/dL (ref 8–23)
CO2: 28 mmol/L (ref 22–32)
Calcium: 8.7 mg/dL — ABNORMAL LOW (ref 8.9–10.3)
Chloride: 104 mmol/L (ref 98–111)
Creatinine, Ser: 0.92 mg/dL (ref 0.44–1.00)
GFR, Estimated: >60 mL/min (ref >60)
Glucose, Bld: 90 mg/dL (ref 70–99)
Potassium: 3.9 mmol/L (ref 3.5–5.1)
Sodium: 140 mmol/L (ref 135–145)

## 2021-12-17 LAB — CBC
HCT: 39.3 % (ref 36.0–46.0)
Hemoglobin: 12.4 g/dL (ref 12.0–15.0)
MCH: 30.2 pg (ref 26.0–34.0)
MCHC: 31.6 g/dL (ref 30.0–36.0)
MCV: 95.9 fL (ref 80.0–100.0)
Platelets: 273 10*3/uL (ref 150–400)
RBC: 4.1 MIL/uL (ref 3.87–5.11)
RDW: 13.2 % (ref 11.5–15.5)
WBC: 6.6 10*3/uL (ref 4.0–10.5)
nRBC: 0 % (ref 0.0–0.2)

## 2021-12-17 LAB — TYPE AND SCREEN
ABO/RH(D): O POS
Antibody Screen: NEGATIVE

## 2021-12-17 NOTE — Progress Notes (Signed)
PCP - Dr. Carol Ada Cardiologist - denies  PPM/ICD - denies   Chest x-ray - 07/30/04 abd w/ chest 2 view EKG - 09/08/21 Stress Test - denies ECHO - denies Cardiac Cath - denies  Sleep Study - around 2005, pt was OSA+ but no longer had issues after weight loss secondary to gastric bypass surgery CPAP - denies  DM- denies  ASA/Blood Thinner Instructions: n/a   ERAS Protcol - no, NPO   COVID TEST- n/a   Anesthesia review: no  Patient denies shortness of breath, fever, cough and chest pain at PAT appointment   All instructions explained to the patient, with a verbal understanding of the material. Patient agrees to go over the instructions while at home for a better understanding. The opportunity to ask questions was provided.

## 2021-12-21 DIAGNOSIS — K5730 Diverticulosis of large intestine without perforation or abscess without bleeding: Secondary | ICD-10-CM | POA: Insufficient documentation

## 2021-12-21 DIAGNOSIS — R600 Localized edema: Secondary | ICD-10-CM | POA: Insufficient documentation

## 2021-12-21 DIAGNOSIS — F419 Anxiety disorder, unspecified: Secondary | ICD-10-CM | POA: Insufficient documentation

## 2021-12-21 DIAGNOSIS — N8500 Endometrial hyperplasia, unspecified: Secondary | ICD-10-CM | POA: Diagnosis present

## 2021-12-21 DIAGNOSIS — Z9884 Bariatric surgery status: Secondary | ICD-10-CM | POA: Insufficient documentation

## 2021-12-21 DIAGNOSIS — I10 Essential (primary) hypertension: Secondary | ICD-10-CM | POA: Insufficient documentation

## 2021-12-21 DIAGNOSIS — D649 Anemia, unspecified: Secondary | ICD-10-CM | POA: Insufficient documentation

## 2021-12-21 DIAGNOSIS — R9389 Abnormal findings on diagnostic imaging of other specified body structures: Secondary | ICD-10-CM | POA: Insufficient documentation

## 2021-12-21 DIAGNOSIS — J309 Allergic rhinitis, unspecified: Secondary | ICD-10-CM | POA: Insufficient documentation

## 2021-12-24 ENCOUNTER — Ambulatory Visit (HOSPITAL_COMMUNITY): Payer: BC Managed Care – PPO | Admitting: Vascular Surgery

## 2021-12-24 ENCOUNTER — Ambulatory Visit (HOSPITAL_COMMUNITY)
Admission: RE | Admit: 2021-12-24 | Discharge: 2021-12-25 | Disposition: A | Discharge status: Home / Self Care | Payer: BC Managed Care – PPO | Attending: Obstetrics and Gynecology | Admitting: Obstetrics and Gynecology

## 2021-12-24 ENCOUNTER — Other Ambulatory Visit: Payer: Self-pay

## 2021-12-24 ENCOUNTER — Encounter (HOSPITAL_COMMUNITY): Payer: Self-pay | Admitting: Obstetrics and Gynecology

## 2021-12-24 ENCOUNTER — Encounter (HOSPITAL_COMMUNITY)
Admission: RE | Disposition: A | Discharge status: Home / Self Care | Payer: Self-pay | Source: Home / Self Care | Attending: Obstetrics and Gynecology

## 2021-12-24 ENCOUNTER — Ambulatory Visit (HOSPITAL_COMMUNITY): Payer: BC Managed Care – PPO | Admitting: Certified Registered"

## 2021-12-24 DIAGNOSIS — F419 Anxiety disorder, unspecified: Secondary | ICD-10-CM | POA: Insufficient documentation

## 2021-12-24 DIAGNOSIS — I10 Essential (primary) hypertension: Secondary | ICD-10-CM | POA: Insufficient documentation

## 2021-12-24 DIAGNOSIS — D252 Subserosal leiomyoma of uterus: Secondary | ICD-10-CM | POA: Insufficient documentation

## 2021-12-24 DIAGNOSIS — N8501 Benign endometrial hyperplasia: Secondary | ICD-10-CM | POA: Insufficient documentation

## 2021-12-24 DIAGNOSIS — J45909 Unspecified asthma, uncomplicated: Secondary | ICD-10-CM | POA: Insufficient documentation

## 2021-12-24 DIAGNOSIS — F32A Depression, unspecified: Secondary | ICD-10-CM | POA: Insufficient documentation

## 2021-12-24 DIAGNOSIS — N8500 Endometrial hyperplasia, unspecified: Secondary | ICD-10-CM | POA: Diagnosis present

## 2021-12-24 DIAGNOSIS — Z86718 Personal history of other venous thrombosis and embolism: Secondary | ICD-10-CM | POA: Diagnosis not present

## 2021-12-24 DIAGNOSIS — N950 Postmenopausal bleeding: Secondary | ICD-10-CM | POA: Insufficient documentation

## 2021-12-24 DIAGNOSIS — Z9884 Bariatric surgery status: Secondary | ICD-10-CM | POA: Diagnosis not present

## 2021-12-24 DIAGNOSIS — Z87891 Personal history of nicotine dependence: Secondary | ICD-10-CM | POA: Insufficient documentation

## 2021-12-24 DIAGNOSIS — Z6841 Body Mass Index (BMI) 40.0 and over, adult: Secondary | ICD-10-CM | POA: Insufficient documentation

## 2021-12-24 DIAGNOSIS — M1990 Unspecified osteoarthritis, unspecified site: Secondary | ICD-10-CM | POA: Insufficient documentation

## 2021-12-24 HISTORY — PX: CYSTOSCOPY: SHX5120

## 2021-12-24 HISTORY — PX: ROBOTIC ASSISTED TOTAL HYSTERECTOMY WITH BILATERAL SALPINGO OOPHERECTOMY: SHX6086

## 2021-12-24 LAB — CBC
HCT: 39.8 % (ref 36.0–46.0)
Hemoglobin: 12.6 g/dL (ref 12.0–15.0)
MCH: 30.5 pg (ref 26.0–34.0)
MCHC: 31.7 g/dL (ref 30.0–36.0)
MCV: 96.4 fL (ref 80.0–100.0)
Platelets: 237 10*3/uL (ref 150–400)
RBC: 4.13 MIL/uL (ref 3.87–5.11)
RDW: 13.2 % (ref 11.5–15.5)
WBC: 9.3 10*3/uL (ref 4.0–10.5)
nRBC: 0 % (ref 0.0–0.2)

## 2021-12-24 LAB — CREATININE, SERUM
Creatinine, Ser: 0.92 mg/dL (ref 0.44–1.00)
GFR, Estimated: >60 mL/min (ref >60)

## 2021-12-24 SURGERY — HYSTERECTOMY, TOTAL, ROBOT-ASSISTED, LAPAROSCOPIC, WITH BILATERAL SALPINGO-OOPHORECTOMY
Anesthesia: General | Site: Bladder

## 2021-12-24 MED ORDER — ONDANSETRON HCL 4 MG/2ML IJ SOLN
INTRAMUSCULAR | Status: AC
  Filled 2021-12-24: qty 2

## 2021-12-24 MED ORDER — DEXAMETHASONE SODIUM PHOSPHATE 10 MG/ML IJ SOLN
INTRAMUSCULAR | Status: AC
  Filled 2021-12-24: qty 1

## 2021-12-24 MED ORDER — ONDANSETRON HCL 4 MG/2ML IJ SOLN: 4.0000 mg | Freq: Four times a day (QID) | INTRAMUSCULAR | Status: DC | PRN

## 2021-12-24 MED ORDER — OXYCODONE HCL 5 MG PO TABS: 5.0000 mg | ORAL_TABLET | Freq: Once | ORAL | Status: DC | PRN

## 2021-12-24 MED ORDER — MORPHINE SULFATE (PF) 2 MG/ML IV SOLN
1.0000 mg | INTRAVENOUS | Status: DC | PRN
  Administered 2021-12-24: 2 mg via INTRAVENOUS
  Filled 2021-12-24: qty 1

## 2021-12-24 MED ORDER — ACETAMINOPHEN 500 MG PO TABS
1000.0000 mg | ORAL_TABLET | Freq: Four times a day (QID) | ORAL | Status: DC
Start: 2021-12-24 — End: 2021-12-25
  Administered 2021-12-24 – 2021-12-25 (×4): 1000 mg via ORAL
  Filled 2021-12-24 (×4): qty 2

## 2021-12-24 MED ORDER — MEPERIDINE HCL 25 MG/ML IJ SOLN: 6.2500 mg | INTRAMUSCULAR | Status: DC | PRN

## 2021-12-24 MED ORDER — ONDANSETRON HCL 4 MG PO TABS: 4.0000 mg | ORAL_TABLET | Freq: Four times a day (QID) | ORAL | Status: DC | PRN

## 2021-12-24 MED ORDER — LACTATED RINGERS IV SOLN: INTRAVENOUS | Status: DC | PRN

## 2021-12-24 MED ORDER — CITALOPRAM HYDROBROMIDE 40 MG PO TABS
40.0000 mg | ORAL_TABLET | Freq: Every day | ORAL | Status: DC
  Administered 2021-12-25: 40 mg via ORAL
  Filled 2021-12-24: qty 1

## 2021-12-24 MED ORDER — PROMETHAZINE HCL 25 MG/ML IJ SOLN: 6.2500 mg | INTRAMUSCULAR | Status: DC | PRN

## 2021-12-24 MED ORDER — ROCURONIUM BROMIDE 10 MG/ML (PF) SYRINGE
PREFILLED_SYRINGE | INTRAVENOUS | Status: AC
Start: 2021-12-24 — End: ?
  Filled 2021-12-24: qty 10

## 2021-12-24 MED ORDER — DEXAMETHASONE SODIUM PHOSPHATE 10 MG/ML IJ SOLN
INTRAMUSCULAR | Status: DC | PRN
  Administered 2021-12-24: 10 mg via INTRAVENOUS

## 2021-12-24 MED ORDER — DOCUSATE SODIUM 100 MG PO CAPS
100.0000 mg | ORAL_CAPSULE | Freq: Two times a day (BID) | ORAL | Status: DC
  Administered 2021-12-24 – 2021-12-25 (×3): 100 mg via ORAL
  Filled 2021-12-24 (×3): qty 1

## 2021-12-24 MED ORDER — STERILE WATER FOR IRRIGATION IR SOLN
Status: DC | PRN
  Administered 2021-12-24: 1000 mL

## 2021-12-24 MED ORDER — LACTATED RINGERS IV SOLN: INTRAVENOUS | Status: DC

## 2021-12-24 MED ORDER — GABAPENTIN 300 MG PO CAPS
300.0000 mg | ORAL_CAPSULE | Freq: Two times a day (BID) | ORAL | Status: DC
  Administered 2021-12-24 – 2021-12-25 (×3): 300 mg via ORAL
  Filled 2021-12-24 (×3): qty 1

## 2021-12-24 MED ORDER — FUROSEMIDE 40 MG PO TABS
40.0000 mg | ORAL_TABLET | Freq: Every day | ORAL | Status: DC
  Administered 2021-12-25: 40 mg via ORAL
  Filled 2021-12-24: qty 1

## 2021-12-24 MED ORDER — PROPOFOL 10 MG/ML IV BOLUS
INTRAVENOUS | Status: AC
  Filled 2021-12-24: qty 20

## 2021-12-24 MED ORDER — HYDROMORPHONE HCL 1 MG/ML IJ SOLN
INTRAMUSCULAR | Status: AC
  Administered 2021-12-24: 0.5 mg via INTRAVENOUS
  Filled 2021-12-24: qty 1

## 2021-12-24 MED ORDER — KETOROLAC TROMETHAMINE 30 MG/ML IJ SOLN
INTRAMUSCULAR | Status: AC
  Filled 2021-12-24: qty 1

## 2021-12-24 MED ORDER — ROCURONIUM BROMIDE 10 MG/ML (PF) SYRINGE
PREFILLED_SYRINGE | INTRAVENOUS | Status: AC
  Filled 2021-12-24: qty 10

## 2021-12-24 MED ORDER — MIDAZOLAM HCL 2 MG/2ML IJ SOLN
INTRAMUSCULAR | Status: DC | PRN
  Administered 2021-12-24: 2 mg via INTRAVENOUS

## 2021-12-24 MED ORDER — CHLORHEXIDINE GLUCONATE 0.12 % MT SOLN
15.0000 mL | Freq: Once | OROMUCOSAL | Status: AC
  Administered 2021-12-24: 15 mL via OROMUCOSAL
  Filled 2021-12-24: qty 15

## 2021-12-24 MED ORDER — OXYCODONE HCL 5 MG/5ML PO SOLN: 5.0000 mg | Freq: Once | ORAL | Status: DC | PRN

## 2021-12-24 MED ORDER — OXYCODONE HCL 5 MG PO TABS: 5.0000 mg | ORAL_TABLET | ORAL | Status: DC | PRN

## 2021-12-24 MED ORDER — ENOXAPARIN (LOVENOX) PATIENT EDUCATION KIT
PACK | Freq: Once | Status: AC
  Filled 2021-12-24: qty 1

## 2021-12-24 MED ORDER — SODIUM CHLORIDE 0.9 % IV SOLN
INTRAVENOUS | Status: DC | PRN
  Administered 2021-12-24: 120 mL

## 2021-12-24 MED ORDER — SUGAMMADEX SODIUM 500 MG/5ML IV SOLN
INTRAVENOUS | Status: AC
  Filled 2021-12-24: qty 5

## 2021-12-24 MED ORDER — SIMETHICONE 80 MG PO CHEW: 80.0000 mg | CHEWABLE_TABLET | Freq: Four times a day (QID) | ORAL | Status: DC | PRN

## 2021-12-24 MED ORDER — LIDOCAINE 2% (20 MG/ML) 5 ML SYRINGE
INTRAMUSCULAR | Status: DC | PRN
  Administered 2021-12-24: 100 mg via INTRAVENOUS

## 2021-12-24 MED ORDER — SODIUM CHLORIDE 0.9 % IR SOLN
Status: DC | PRN
  Administered 2021-12-24: 1000 mL

## 2021-12-24 MED ORDER — HYDROMORPHONE HCL 1 MG/ML IJ SOLN
0.2500 mg | INTRAMUSCULAR | Status: DC | PRN
  Administered 2021-12-24: 0.25 mg via INTRAVENOUS

## 2021-12-24 MED ORDER — KETOROLAC TROMETHAMINE 30 MG/ML IJ SOLN
30.0000 mg | Freq: Once | INTRAMUSCULAR | Status: AC
  Administered 2021-12-24: 30 mg via INTRAVENOUS

## 2021-12-24 MED ORDER — MIDAZOLAM HCL 2 MG/2ML IJ SOLN
INTRAMUSCULAR | Status: AC
Start: 2021-12-24 — End: ?
  Filled 2021-12-24: qty 2

## 2021-12-24 MED ORDER — SUGAMMADEX SODIUM 200 MG/2ML IV SOLN
INTRAVENOUS | Status: DC | PRN
  Administered 2021-12-24: 300 mg via INTRAVENOUS

## 2021-12-24 MED ORDER — LIDOCAINE 2% (20 MG/ML) 5 ML SYRINGE
INTRAMUSCULAR | Status: AC
  Filled 2021-12-24: qty 5

## 2021-12-24 MED ORDER — ROPIVACAINE HCL 5 MG/ML IJ SOLN
INTRAMUSCULAR | Status: AC
  Filled 2021-12-24: qty 60

## 2021-12-24 MED ORDER — ENOXAPARIN SODIUM 40 MG/0.4ML IJ SOSY
40.0000 mg | PREFILLED_SYRINGE | INTRAMUSCULAR | Status: DC
  Administered 2021-12-24: 40 mg via SUBCUTANEOUS
  Filled 2021-12-24: qty 0.4

## 2021-12-24 MED ORDER — ORAL CARE MOUTH RINSE: 15.0000 mL | Freq: Once | OROMUCOSAL | Status: AC

## 2021-12-24 MED ORDER — FLUORESCEIN SODIUM 10 % IV SOLN: 15.0000 mg/kg | Freq: Once | INTRAVENOUS | Status: DC

## 2021-12-24 MED ORDER — CEFAZOLIN IN SODIUM CHLORIDE 3-0.9 GM/100ML-% IV SOLN
3.0000 g | INTRAVENOUS | Status: AC
Start: 2021-12-24 — End: 2021-12-24
  Administered 2021-12-24: 3 g via INTRAVENOUS
  Filled 2021-12-24: qty 100

## 2021-12-24 MED ORDER — ONDANSETRON HCL 4 MG/2ML IJ SOLN
INTRAMUSCULAR | Status: DC | PRN
  Administered 2021-12-24: 4 mg via INTRAVENOUS

## 2021-12-24 MED ORDER — SODIUM CHLORIDE (PF) 0.9 % IJ SOLN
INTRAMUSCULAR | Status: AC
  Filled 2021-12-24: qty 10

## 2021-12-24 MED ORDER — FENTANYL CITRATE (PF) 250 MCG/5ML IJ SOLN
INTRAMUSCULAR | Status: AC
  Filled 2021-12-24: qty 5

## 2021-12-24 MED ORDER — FENTANYL CITRATE (PF) 100 MCG/2ML IJ SOLN
INTRAMUSCULAR | Status: DC | PRN
  Administered 2021-12-24 (×2): 50 ug via INTRAVENOUS
  Administered 2021-12-24: 100 ug via INTRAVENOUS
  Administered 2021-12-24: 50 ug via INTRAVENOUS

## 2021-12-24 MED ORDER — PROPOFOL 10 MG/ML IV BOLUS
INTRAVENOUS | Status: DC | PRN
  Administered 2021-12-24: 200 mg via INTRAVENOUS

## 2021-12-24 MED ORDER — ROCURONIUM BROMIDE 10 MG/ML (PF) SYRINGE
PREFILLED_SYRINGE | INTRAVENOUS | Status: DC | PRN
  Administered 2021-12-24: 50 mg via INTRAVENOUS
  Administered 2021-12-24: 10 mg via INTRAVENOUS
  Administered 2021-12-24: 100 mg via INTRAVENOUS

## 2021-12-24 MED ORDER — LISINOPRIL 10 MG PO TABS
10.0000 mg | ORAL_TABLET | Freq: Every day | ORAL | Status: DC
Start: 2021-12-24 — End: 2021-12-25
  Administered 2021-12-24: 10 mg via ORAL
  Filled 2021-12-24 (×2): qty 1

## 2021-12-24 MED ORDER — SODIUM CHLORIDE (PF) 0.9 % IJ SOLN
INTRAMUSCULAR | Status: AC
  Filled 2021-12-24: qty 50

## 2021-12-24 MED ORDER — METHYLENE BLUE 1 % INJ SOLN
INTRAVENOUS | Status: AC
  Filled 2021-12-24: qty 10

## 2021-12-24 MED ORDER — PANTOPRAZOLE SODIUM 40 MG IV SOLR
40.0000 mg | Freq: Every day | INTRAVENOUS | Status: DC
  Administered 2021-12-24: 40 mg via INTRAVENOUS
  Filled 2021-12-24: qty 10

## 2021-12-24 SURGICAL SUPPLY — 54 items
ADH SKN CLS APL DERMABOND .7 (GAUZE/BANDAGES/DRESSINGS) ×2
APL SRG 38 LTWT LNG FL B (MISCELLANEOUS) ×2
APPLICATOR ARISTA FLEXITIP XL (MISCELLANEOUS) ×1 IMPLANT
CATH FOLEY 3WAY  5CC 16FR (CATHETERS) ×3
CATH FOLEY 3WAY 5CC 16FR (CATHETERS) ×2 IMPLANT
COVER BACK TABLE 60X90IN (DRAPES) ×3 IMPLANT
COVER TIP SHEARS 8 DVNC (MISCELLANEOUS) ×2 IMPLANT
COVER TIP SHEARS 8MM DA VINCI (MISCELLANEOUS) ×3
DEFOGGER SCOPE WARMER CLEARIFY (MISCELLANEOUS) ×3 IMPLANT
DERMABOND ADVANCED (GAUZE/BANDAGES/DRESSINGS) ×1
DERMABOND ADVANCED .7 DNX12 (GAUZE/BANDAGES/DRESSINGS) ×2 IMPLANT
DRAPE ARM DVNC X/XI (DISPOSABLE) ×8 IMPLANT
DRAPE COLUMN DVNC XI (DISPOSABLE) ×2 IMPLANT
DRAPE DA VINCI XI ARM (DISPOSABLE) ×12
DRAPE DA VINCI XI COLUMN (DISPOSABLE) ×3
DRAPE UTILITY W/TAPE 26X15 (DRAPES) ×3 IMPLANT
DURAPREP 26ML APPLICATOR (WOUND CARE) ×3 IMPLANT
ELECT REM PT RETURN 9FT ADLT (ELECTROSURGICAL) ×3
ELECTRODE REM PT RTRN 9FT ADLT (ELECTROSURGICAL) ×2 IMPLANT
GAUZE 4X4 16PLY ~~LOC~~+RFID DBL (SPONGE) ×6 IMPLANT
GLOVE BIOGEL PI IND STRL 7.0 (GLOVE) ×12 IMPLANT
GLOVE BIOGEL PI INDICATOR 7.0 (GLOVE) ×6
GLOVE SURG ENC TEXT LTX SZ7 (GLOVE) ×9 IMPLANT
HEMOSTAT ARISTA ABSORB 3G PWDR (HEMOSTASIS) ×1 IMPLANT
HIBICLENS CHG 4% 4OZ BTL (MISCELLANEOUS) ×3 IMPLANT
IRRIGATION STRYKERFLOW (MISCELLANEOUS) ×2 IMPLANT
IRRIGATOR STRYKERFLOW (MISCELLANEOUS) ×3
LEGGING LITHOTOMY PAIR STRL (DRAPES) ×3 IMPLANT
OBTURATOR OPTICAL STANDARD 8MM (TROCAR) ×3
OBTURATOR OPTICAL STND 8 DVNC (TROCAR) ×2
OBTURATOR OPTICALSTD 8 DVNC (TROCAR) IMPLANT
OCCLUDER COLPOPNEUMO (BALLOONS) ×3 IMPLANT
PACK ROBOT WH (CUSTOM PROCEDURE TRAY) ×3 IMPLANT
PACK ROBOTIC GOWN (GOWN DISPOSABLE) ×3 IMPLANT
PAD OB MATERNITY 4.3X12.25 (PERSONAL CARE ITEMS) ×3 IMPLANT
PROTECTOR NERVE ULNAR (MISCELLANEOUS) ×3 IMPLANT
SCISSORS LAP 5X45 EPIX DISP (ENDOMECHANICALS) ×1 IMPLANT
SEAL CANN UNIV 5-8 DVNC XI (MISCELLANEOUS) ×8 IMPLANT
SEAL XI 5MM-8MM UNIVERSAL (MISCELLANEOUS) ×12
SEALER VESSEL DA VINCI XI (MISCELLANEOUS) ×3
SEALER VESSEL EXT DVNC XI (MISCELLANEOUS) ×2 IMPLANT
SET IRRIG Y TYPE TUR BLADDER L (SET/KITS/TRAYS/PACK) ×1 IMPLANT
SET TRI-LUMEN FLTR TB AIRSEAL (TUBING) ×3 IMPLANT
SPIKE FLUID TRANSFER (MISCELLANEOUS) ×8 IMPLANT
SUT VIC AB 0 CT1 27 (SUTURE) ×6
SUT VIC AB 0 CT1 27XBRD ANBCTR (SUTURE) ×4 IMPLANT
SUT VICRYL RAPIDE 4/0 PS 2 (SUTURE) ×6 IMPLANT
SUT VLOC 180 0 9IN  GS21 (SUTURE) ×3
SUT VLOC 180 0 9IN GS21 (SUTURE) ×2 IMPLANT
TIP UTERINE 6.7X8CM BLUE DISP (MISCELLANEOUS) ×1 IMPLANT
TOWEL GREEN STERILE (TOWEL DISPOSABLE) ×3 IMPLANT
TROCAR PORT AIRSEAL 8X120 (TROCAR) ×3 IMPLANT
UNDERPAD 30X36 HEAVY ABSORB (UNDERPADS AND DIAPERS) ×3 IMPLANT
WATER STERILE IRR 1000ML POUR (IV SOLUTION) ×3 IMPLANT

## 2021-12-24 NOTE — Progress Notes (Signed)
Call was made to patient to notify her regarding surgical findings. She is feeling well - has been resting. Has not voided quite yet. Otherwise, pain is well-controlled. Likely D/C home tomorrow.  [name], DO

## 2021-12-24 NOTE — Transfer of Care (Signed)
Immediate Anesthesia Transfer of Care Note  Patient: Jozelynn Danielson Tool  Procedure(s) Performed: XI ROBOTIC ASSISTED TOTAL HYSTERECTOMY WITH BILATERAL SALPINGO OOPHORECTOMY (Abdomen) CYSTOSCOPY (Bladder)  Patient Location: PACU  Anesthesia Type:General  Level of Consciousness: awake and oriented  Airway & Oxygen Therapy: Patient Spontanous Breathing and Patient connected to nasal cannula oxygen  Post-op Assessment: Report given to RN  Post vital signs: Reviewed and stable  Last Vitals:  Vitals Value Taken Time  BP 144/68 12/24/21 1134  Temp    Pulse 55 12/24/21 1139  Resp 14 12/24/21 1139  SpO2 97 % 12/24/21 1139  Vitals shown include unvalidated device data.  Last Pain:  Vitals:   12/24/21 0709  TempSrc:   PainSc: 0-No pain      Patients Stated Pain Goal: 0 (71/24/58 0998)  Complications: No notable events documented.

## 2021-12-24 NOTE — Op Note (Addendum)
Pre Op Dx:   1. Endometrial hyperplasia (simple and complex without atypia) 2. Recurrent postmenopausal bleeding 3. Fibroid uterus  Post Op Dx:   Same as pre-operative diagnoses  Procedure:   1. Robotic Assisted Total Laparoscopic Hysterectomy with Bilateral Salpingo-oophorectomy 2. Cystoscopy   Surgeon:  Dr. [name] Assistants:  Dr. Christophe Louis (assistant needed due to the complexity of the anatomy) Anesthesia:  General   EBL:  10cc  IVF:  1000cc UOP:  250cc clear yellow urine   Drains:  Foley catheter Specimen removed:  Uterus, cervix, bilateral fallopian tubes, and ovaries - sent to pathology Device(s) implanted: None Case Type:  Clean-contaminated Findings:  Small ~7cm uterus with visible ~4cm fibroid present at the fundus. Normal-appearing bilateral fallopian tubes and ovaries. Right paratubal cyst present. Surgical clip noted anterior to the uterus. Bilateral ureters visualized with peristalsis. Normal-appearing liver. Absence of abdominopelvic adhesive disease. On cystoscopy, bilateral ureteral jets were noted. Complications: None  Indications:  71 y.o. postmenopausal G2P0 with recurrent postmenopausal bleeding with endometrial pathology with simple and complex hyperplasia without atypia. Patient desired definitive surgical management.  Description of each procedure:  After informed consent the patient was taken to the operating room and placed in dorsal supine position where general endotracheal anesthesia was administered and found to be adequate.  She was placed in dorsal lithotomy position with her arms tucked.  She was prepped and draped in the usual sterile fashion. A RUMI uterine manipulator with the Koh cup and a Foley catheter were placed.  A timeout was called and the procedure confirmed.    A 62m supraumbilical incision was made and a trochar was used to enter the abdomen under direct visualization.  Pneumoperitoneum was established and atraumatic entry  confirmed. Three additional 532mports were placed on either side of the umbilicus and an 13m71mort was placed in the left upper quadrant under direct visualization. All port sites were injected with 10cc local anesthetic. The pelvis was bathed in a 60cc Ropivicaine solution.  The patient was placed in Trendelenburg position and the da AT[employer]botic device was docked. Next, attention was turned to the console where the hysterectomy was performed. The right ureter was identified.  The right fallopian tube was divided at the mesosalpinx and the right infundibulopelvic ligament was divided using electrosurgery. The uteroovarian anastamosis was divided and the right round ligament was divided.  This process was repeated on the contralateral side. The anterior leaflet of the peritoneum was divided to create a bladder flap. The uterine artery and vein were skeletonized and desiccated superior to the Koh cup.  This process was repeated on the contralateral side.  Uterine blanching was observed.  A circumferential colpotomy was created along the ridge of the Koh cup and the uterus was passed off the field.  The vaginal occluder was placed in the vagina to maintain pneumoperitoneum.  The vaginal cuff was then closed with V-loc suture. Hemostasis confirmed. Arista powder was placed on the vaginal cuff and adnexa bilaterally.  The Da Vinci robotic device was undocked and all ports were visualized.   The pneumoperitoneum was reduced completely with the assistance of two deep breaths and all ports were removed.  The skin was closed with 4-0 Vicryl in subcuticular fashion with skin glue placed atop each port site.   The foley catheter was removed. Cystoscopy was performed and demonstrated intact urothelium throughout the bladder, a normal-appearing trigone and bilaterally patent ureteral orifices with normal urine jets noted.  The cystoscope was removed. The vagina  was inspected and there were no vaginal tears noted and no  foreign objects remaining in the vagina. The patient was returned to dorsal supine position, awakened and extubated in the OR having appeared to tolerate the procedure well.  All sponge, needle, and instrument counts were correct x 2 at the end of the case.  Disposition:  PACU  [name], DO

## 2021-12-24 NOTE — Progress Notes (Signed)
1245- Patient arrived to unit into room 6N14. A&O x4, VSS. Lap sites x5 CDI, dermabond. Sister, Mardene Celeste, at bedside. Admission complete. Skin assessment performed with charge RN, Hartford Poli

## 2021-12-24 NOTE — Anesthesia Postprocedure Evaluation (Signed)
Anesthesia Post Note  Patient: Nichole Frank  Procedure(s) Performed: XI ROBOTIC ASSISTED TOTAL HYSTERECTOMY WITH BILATERAL SALPINGO OOPHORECTOMY (Abdomen) CYSTOSCOPY (Bladder)     Patient location during evaluation: PACU Anesthesia Type: General Level of consciousness: awake and alert Pain management: pain level controlled Vital Signs Assessment: post-procedure vital signs reviewed and stable Respiratory status: spontaneous breathing, nonlabored ventilation and respiratory function stable Cardiovascular status: blood pressure returned to baseline and stable Postop Assessment: no apparent nausea or vomiting Anesthetic complications: no   No notable events documented.  Last Vitals:  Vitals:   12/24/21 1215 12/24/21 1255  BP:  135/71  Pulse:  63  Resp:    Temp: 37.2 C 36.6 C  SpO2:  97%    Last Pain:  Vitals:   12/24/21 1255  TempSrc: Oral  PainSc: 0-No pain                 Lynda Rainwater

## 2021-12-24 NOTE — Interval H&P Note (Signed)
History and Physical Interval Note:  12/24/2021 8:18 AM  Nichole Frank  has presented today for surgery, with the diagnosis of Endometrial Hysperplasia without Atypia.  The various methods of treatment have been discussed with the patient and family. After consideration of risks, benefits and other options for treatment, the patient has consented to  Procedure(s): XI ROBOTIC ASSISTED TOTAL HYSTERECTOMY WITH BILATERAL SALPINGO OOPHORECTOMY (N/A) as a surgical intervention.  The patient's history has been reviewed, patient examined, no change in status, stable for surgery. 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 I have reviewed the patient's chart and labs.  Questions were answered to the patient's satisfaction.     [name]

## 2021-12-24 NOTE — Anesthesia Preprocedure Evaluation (Signed)
Anesthesia Evaluation  Patient identified by MRN, date of birth, ID band Patient awake    Reviewed: Allergy & Precautions, NPO status , Patient's Chart, lab work & pertinent test results  History of Anesthesia Complications (+) PONV and history of anesthetic complications  Airway Mallampati: III  TM Distance: >3 FB Neck ROM: Full    Dental  (+) Teeth Intact, Dental Advisory Given   Pulmonary asthma (only uses inhaler when sick) , former smoker,  Quit smoking 1993, 25 pack year history  Had sleep study and CPAP pre-gastric bypass (18 years ago), hasnt used since gastric bypass. Unsure if she still snores   Pulmonary exam normal breath sounds clear to auscultation       Cardiovascular hypertension, Pt. on medications + DVT (RLE 1993)  Normal cardiovascular exam Rhythm:Regular Rate:Normal     Neuro/Psych  Headaches, PSYCHIATRIC DISORDERS Anxiety Depression    GI/Hepatic Neg liver ROS, Gastric bypass 18 years ago   Endo/Other  Morbid obesityBMI 49  Renal/GU negative Renal ROS  negative genitourinary   Musculoskeletal  (+) Arthritis , Osteoarthritis,    Abdominal (+) + obese,   Peds  Hematology negative hematology ROS (+)   Anesthesia Other Findings   Reproductive/Obstetrics Postmenopausal bleeding                              Anesthesia Physical  Anesthesia Plan  ASA: 3  Anesthesia Plan: General   Post-op Pain Management: Tylenol PO (pre-op)*   Induction: Intravenous  PONV Risk Score and Plan: 4 or greater and Ondansetron, Dexamethasone, Midazolam and Droperidol  Airway Management Planned: Oral ETT  Additional Equipment: None  Intra-op Plan:   Post-operative Plan: Extubation in OR  Informed Consent: I have reviewed the patients History and Physical, chart, labs and discussed the procedure including the risks, benefits and alternatives for the proposed anesthesia with the  patient or authorized representative who has indicated his/her understanding and acceptance.     Dental advisory given  Plan Discussed with: CRNA  Anesthesia Plan Comments:         Anesthesia Quick Evaluation

## 2021-12-24 NOTE — Anesthesia Procedure Notes (Signed)
Procedure Name: Intubation Date/Time: 12/24/2021 8:49 AM  Performed by: Barrington Ellison, CRNAPre-anesthesia Checklist: Patient identified, Emergency Drugs available, Suction available and Patient being monitored Patient Re-evaluated:Patient Re-evaluated prior to induction Oxygen Delivery Method: Circle System Utilized Preoxygenation: Pre-oxygenation with 100% oxygen Induction Type: IV induction Ventilation: Mask ventilation without difficulty Laryngoscope Size: Mac and 3 Grade View: Grade I Tube type: Oral Tube size: 7.0 mm Number of attempts: 1 Airway Equipment and Method: Stylet and Oral airway Placement Confirmation: ETT inserted through vocal cords under direct vision, positive ETCO2 and breath sounds checked- equal and bilateral Secured at: 21 cm Tube secured with: Tape Dental Injury: Teeth and Oropharynx as per pre-operative assessment

## 2021-12-25 ENCOUNTER — Other Ambulatory Visit (HOSPITAL_COMMUNITY): Payer: Self-pay

## 2021-12-25 ENCOUNTER — Encounter (HOSPITAL_COMMUNITY): Payer: Self-pay | Admitting: Obstetrics and Gynecology

## 2021-12-25 DIAGNOSIS — N950 Postmenopausal bleeding: Secondary | ICD-10-CM | POA: Diagnosis not present

## 2021-12-25 LAB — SURGICAL PATHOLOGY

## 2021-12-25 MED ORDER — GABAPENTIN 300 MG PO CAPS
300.0000 mg | ORAL_CAPSULE | Freq: Two times a day (BID) | ORAL | 0 refills | Status: DC
  Filled 2021-12-25: qty 14, 7d supply, fill #0

## 2021-12-25 MED ORDER — OXYCODONE HCL 5 MG PO TABS
5.0000 mg | ORAL_TABLET | Freq: Four times a day (QID) | ORAL | 0 refills | Status: AC | PRN
  Filled 2021-12-25: qty 20, 5d supply, fill #0

## 2021-12-25 MED ORDER — ENOXAPARIN SODIUM 40 MG/0.4ML IJ SOSY
40.0000 mg | PREFILLED_SYRINGE | INTRAMUSCULAR | 0 refills | Status: DC
  Filled 2021-12-25: qty 4, 10d supply, fill #0

## 2021-12-25 NOTE — Progress Notes (Signed)
09:10- Discharge orders placed. AVS printed and reviewed with patient. Lovenox inject education performed and patient demonstrated competence, Patient education kit at bedside and reviewed with patient. All questions concerns addressed. Awaiting delivery of medications being filled by inpatient pharmacy. Patient called sister to pick her up, to be discharged home via private auto.

## 2021-12-25 NOTE — Discharge Summary (Signed)
Physician Discharge Summary  Patient ID: Nichole Frank MRN: [id number] DOB/AGE: 07-23-1950 71 y.o.  Admit date: 12/24/2021 Discharge date: 12/25/2021  Admission Diagnoses: Endometrial hyperplasia without atypia Postmenopausal bleeding Fibroid uterus  Discharge Diagnoses:  Principal Problem:   Endometrial hyperplasia without atypia Postmenopausal bleeding Fibroid uterus  Procedure(s):  1. Robotic Assisted Total Laparoscopic Hysterectomy with Bilateral Salpingo-oophorectomy 2. Cystoscopy  Discharged Condition: good  Hospital Course: Patient was admitted on 12/24/2021 for the above named procedure(s) for the above named diagnoses. Prior to hospital discharge, patient was tolerating PO, ambulating, voiding spontaneously multiple times, passing flatus, and pain was well-controlled. See hospital chart for specific details. Patient planning a trip to [place] in 1 week - counseled that this is not advisable; however, patient will still plan to go. Patient has a person to drive and plans to take frequent stops. Patient will use Lovenox 10 days post-operatively for chemoprophylaxis. Patient was discharged home in stable condition.  Consults: None  Significant Diagnostic Studies: labs:     Latest Ref Rng & Units 12/24/2021    1:33 PM 12/17/2021    8:58 AM 09/08/2021   10:35 AM  CBC  WBC 4.0 - 10.5 K/uL 9.3  6.6  4.8   Hemoglobin 12.0 - 15.0 g/dL 12.6  12.4  11.8   Hematocrit 36.0 - 46.0 % 39.8  39.3  36.2   Platelets 150 - 400 K/uL 237  273  221       Latest Ref Rng & Units 12/24/2021    1:33 PM 12/17/2021    8:58 AM 09/08/2021   10:35 AM  CMP  Glucose 70 - 99 mg/dL  90  87   BUN 8 - 23 mg/dL  19  12   Creatinine 0.44 - 1.00 mg/dL 0.92  0.92  0.86   Sodium 135 - 145 mmol/L  140  140   Potassium 3.5 - 5.1 mmol/L  3.9  3.7   Chloride 98 - 111 mmol/L  104  110   CO2 22 - 32 mmol/L  28  25   Calcium 8.9 - 10.3 mg/dL  8.7  8.0     Treatments: surgery: As above  Discharge  Exam: Blood pressure 122/61, pulse 96, temperature 97.6 F (36.4 C), temperature source Oral, resp. rate 17, height '5\' 7"'$  (1.702 m), weight (!) 140.6 kg, SpO2 97 %. Gen:  NAD, pleasant and cooperative Cardio:  RRR Pulm:  CTAB, no wheezes/rales/rhonchi Abd:  Soft, non-distended, non-tender throughout, no rebound/guarding Ext:  No bilateral LE edema, no bilateral calf tenderness  Disposition: Discharge disposition: 01-Home or Self Care       Discharge Instructions     Call MD for:  difficulty breathing, headache or visual disturbances   Complete by: As directed    Call MD for:  extreme fatigue   Complete by: As directed    Call MD for:  hives   Complete by: As directed    Call MD for:  persistant dizziness or light-headedness   Complete by: As directed    Call MD for:  persistant nausea and vomiting   Complete by: As directed    Call MD for:  redness, tenderness, or signs of infection (pain, swelling, redness, odor or green/yellow discharge around incision site)   Complete by: As directed    Call MD for:  severe uncontrolled pain   Complete by: As directed    Call MD for:  temperature >100.4   Complete by: As directed    Diet - low sodium  heart healthy   Complete by: As directed    Driving restriction    Complete by: As directed    Avoid driving for at least 2 weeks.   Lifting restrictions   Complete by: As directed    Weight restriction of 10 lbs.   Sexual activity   Complete by: As directed    No intercourse for at least 6-8 weeks.      Allergies as of 12/25/2021       Reactions   Adhesive [tape] Other (See Comments)   AGENT:Glue on Steri Strips REACTION: welts   Flaxseed (linseed) Other (See Comments)   REACTION: migraines Flaxseed oil    Grapefruit Diet Or [extra Strength Grapefruit] Hives   Molds & Smuts Other (See Comments)   Upper respiratory problems        Medication List     TAKE these medications    acetaminophen 500 MG tablet Commonly  known as: TYLENOL Take 500 mg by mouth at bedtime.   Celebrate Calcium Citrate 500-12.5 MG-MCG Chew Generic drug: Calcium Citrate-Vitamin D Chew 1 each by mouth daily.   citalopram 40 MG tablet Commonly known as: CELEXA Take 40 mg by mouth daily.   enoxaparin 40 MG/0.4ML injection Commonly known as: LOVENOX Inject 0.4 mLs (40 mg total) into the skin daily for 10 days.   furosemide 40 MG tablet Commonly known as: LASIX Take 40 mg by mouth daily.   gabapentin 300 MG capsule Commonly known as: NEURONTIN Take 1 capsule (300 mg total) by mouth 2 (two) times daily for 7 days.   lisinopril 10 MG tablet Commonly known as: ZESTRIL Take 10 mg by mouth daily.   loperamide 2 MG tablet Commonly known as: IMODIUM A-D Take 2 mg by mouth daily.   naproxen sodium 220 MG tablet Commonly known as: ALEVE Take 220-440 mg by mouth 2 (two) times daily as needed (pain).   oxyCODONE 5 MG immediate release tablet Commonly known as: Oxy IR/ROXICODONE Take 1-2 tablets (5-10 mg total) by mouth every 6 (six) hours as needed for up to 5 days for moderate pain, severe pain or breakthrough pain.   PRESERVISION AREDS 2+MULTI VIT PO Take 1 capsule by mouth daily.   Celebrate Multi-Complete 60 Chew Chew 1 each by mouth daily. With Iron   PROBIOTIC DAILY PO Take 1 capsule by mouth daily.   Systane Preservative Free 0.4-0.3 % Soln Generic drug: Polyethyl Glyc-Propyl Glyc PF Place 1 drop into both eyes 2 (two) times daily as needed (dry eyes).        Follow-up Information     [name], DO Follow up in 2 week(s).   Specialty: Obstetrics and Gynecology Why: Please keep your previously scheduled post-operative visit. Contact information: [address] [place] [address] [phone]                 Signed: [name] 12/25/2021, 8:08 AM

## 2022-03-03 ENCOUNTER — Other Ambulatory Visit: Payer: Self-pay | Admitting: Gastroenterology

## 2022-05-12 ENCOUNTER — Encounter (HOSPITAL_COMMUNITY): Payer: Self-pay | Admitting: Gastroenterology

## 2022-05-18 NOTE — H&P (Signed)
History of Present Illness General:         Patient is a 71 y/o female presenting for repeat colonoscopy.        Today patient reports no GI complaints.        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 Penelope Coop. notes she has decreased her dairy intake. Notes excess sugar worsens diarrhea. She has had a previous cholecystectomy. Notes diarrhea is slowly worsening. We will have 2-5 bowel movements daily. Notes intermittent loose stools. Denies melena, hematochezia.        h/o cholecystectomy        Denies previous MI, CVA, TIA. No recent cardiac ablations, cardioversion or pacemaker placement.        Denies nausea, vomiting, constipation, abdominal pain, straining, and unintentional weight loss. Denies family history of colon cancer. Denies blood thinner use. Denies NSAID use.        Colonoscopy 09/24/2016: 2 tubular adenomas, both 4 mm, recall 5 years.  Current Medications Taking Citalopram Hydrobromide 40 MG Tablet 1 tablet Orally Once a day Furosemide 40 MG Tablet 1 tablet Orally Once a day Iron . Tablet 1 tablet Orally Once a day , Notes to Pharmacist: chewable Lisinopril 10 MG Tablet 1 tablet Orally Once a day Nystatin 100000 UNIT/GM Powder 1 application Externally Twice a day , Notes to Pharmacist: as needed Vitamin D3 Ciclopirox 8 % Solution 1 application Externally Once a day Align(Probiotic Product) 4 MG Capsule 1 capsule by mouth once a day Caltrate 600+D(Calcium Carb-Cholecalciferol) 600-400 MG-UNIT Tablet 1 tablet Orally Once a day MVI multi complete 60 with iron A, C, d, b12, iron 2 chewable bid OTC Celebrate vitamins 1 chewable orally qd PreserVision AREDS 2 - Capsule 1 capsule Orally once a day Carbonyl Iron 15 MG Tablet Chewable 3 tablets Orally Imodium A-D(Loperamide HCl) 2 MG Tablet 1 tablet Orally as needed Not-Taking Benzonatate 200 MG Capsule 1 capsule Orally at bedtime Doxycycline  Hyclate 100 MG Tablet 1 tablet Orally Twice a day , Notes to Pharmacist: Finished 09/19 Past Medical History      hypertension - [facility] 4/14.      Peripheral edema.      Melanoma right temple.      rectal polyps 07/26/06-Dr. Penelope Coop, repeat colonoscopy 10 years.      Diverticulosis.      Obesity h/o bariatric surgery.      colonoscopy 09/24/2016 benign adenomatous polyps Dr. Penelope Coop repeat 5 years.      melanoma - Dr. Pearline Cables, derm.      Osteopenia, -1.9. 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 Delora Fuel 12/24/2021 Family History Father: deceased, MI @ 42 Mother: deceased, Lymphoma at age 89, hysterectomy Paternal [place] Father: deceased Paternal [place] Mother: deceased, DM Maternal Grand Father: deceased Maternal Grand Mother: deceased, Diverticulitis reguiring surgery and temporary colostomy Sister 1:  alive, colon polyp 1 sister(s) - healthy. NEG Gi hx for colon cancer, or liver disease Sister has colon polyps. Social History General:  Tobacco use      cigarettes:  Former smoker     Tobacco history last updated  03/03/2022     Vaping  No EXPOSURE TO PASSIVE SMOKE: no, quit at age 11. Alcohol: yes, 2 per week. Caffeine: yes, 2+ servings daily , soda. Recreational drug use: no, no. Marital Status: Single. Children: No. 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 COMMUNICATION BARRIERS: no hearing, vision or cognition issues. Religion: [employer]. Seat belt use: always. Gyn History Sexual activity not currently sexually active.  Periods : postmenopausal.  LMP 12/2001,  PMB- started on 5/17.  Denies H/O Birth control.  Last pap smear date 11/2020- NILM.  Last mammogram date 06/20/2021.  Denies H/O Abnormal pap smear none.  Denies H/O STD.  Menarche 10.  OB History Pregnancy # 1  abortion.  Pregnancy # 2  abortion.  Allergies N.K.D.A. Hospitalization/Major Diagnostic Procedure in 08/2021 009/2023 Review of Systems GI PROCEDURE:         Pacemaker/ AICD no.  Artificial heart valves no.  MI/heart attack no.  Abnormal heart rhythm no.  Angina no.  CVA no.  Hypertension  YES.  Hypotension no.  Asthma, COPD no.  Sleep apnea no.  Seizure disorders no.  Artificial joints YES Bilateral knees and Righ Hip .  Diabetes no.  Significant headaches no.  Vertigo no.  Depression/anxiety YES         .  Abnormal bleeding no.  Kidney Disease no.  Liver disease no.  Blood transfusion no   Vital Signs Wt: 327.4, Wt change: 3.4 lbs, Ht: 66.25, BMI: 52.44, Temp: 97.2, Pulse sitting: 56, BP sitting: 129/73. Examination Gastroenterology Exam:        GENERAL APPEARANCE: Well developed, well nourished, no active distress, pleasant, no acute distress.         RESPIRATORY Breath sounds normal. Respiration even and unlabored.         CARDIOVASCULAR Normal RRR w/o murmers or gallops. No peripheral edema.         ABDOMEN No masses palpated. Liver and spleen not palpated, normal. Bowel sounds normal, Abdomen not distended.         EXTREMITIES: No edema, pulses intact.         SKIN Warm and dry, good turgor without rashes.  Assessments 1. History of colon polyps - Z86.010 (Primary) 2. Diarrhea, unspecified type - R19.7   Treatment 1. History of colon polyps  Notes: Patient is scheduled for colonoscopy. I thoroughly discussed the procedure with the patient including but not limited to nature, alternatives, benefits and risks (including but not limited to bleeding, infection, perforation, anesthesia/cardiopulmonary complications. All questions were answered and the patient acknowledges  these risks and wishes to proceed with colonoscopy.   Clinical Notes: Patient due for repeat colonoscopy. Will need to schedule a hospital due to BMI over 50.             2. Diarrhea, unspecified type  Start Colestipol HCl Tablet, 1 GM, 2 tablets, Orally, Once a day, 30 day(s), 60 Clinical Notes: Patient with chronic diarrhea. Previously managed with probiotics and Imodium. Patient notes diarrhea is slowly worsening. We will plan for biopsies during colonoscopy for microscopic colitis. Recommending patient can try colestipol 2 tablets once daily for her diarrhea. If this causes constipation recommend she return to Imodium or Pepto-Bismol as needed for diarrhea. Will evaluate  with anemia for with CBC, CMP, iron and ferritin panel. Patient previously had low normal iron and ferritin. Last CBC 2022.

## 2022-05-19 ENCOUNTER — Encounter (HOSPITAL_COMMUNITY): Payer: Self-pay | Admitting: Gastroenterology

## 2022-05-19 ENCOUNTER — Ambulatory Visit (HOSPITAL_COMMUNITY): Payer: BC Managed Care – PPO | Admitting: Certified Registered Nurse Anesthetist

## 2022-05-19 ENCOUNTER — Ambulatory Visit (HOSPITAL_COMMUNITY)
Admission: RE | Admit: 2022-05-19 | Discharge: 2022-05-19 | Disposition: A | Discharge status: Home / Self Care | Payer: BC Managed Care – PPO | Attending: Gastroenterology | Admitting: Gastroenterology

## 2022-05-19 ENCOUNTER — Encounter (HOSPITAL_COMMUNITY)
Admission: RE | Disposition: A | Discharge status: Home / Self Care | Payer: Self-pay | Source: Home / Self Care | Attending: Gastroenterology

## 2022-05-19 ENCOUNTER — Other Ambulatory Visit: Payer: Self-pay

## 2022-05-19 DIAGNOSIS — Z9884 Bariatric surgery status: Secondary | ICD-10-CM | POA: Diagnosis not present

## 2022-05-19 DIAGNOSIS — K648 Other hemorrhoids: Secondary | ICD-10-CM | POA: Diagnosis not present

## 2022-05-19 DIAGNOSIS — Z86010 Personal history of colonic polyps: Secondary | ICD-10-CM | POA: Diagnosis not present

## 2022-05-19 DIAGNOSIS — Z1211 Encounter for screening for malignant neoplasm of colon: Secondary | ICD-10-CM | POA: Insufficient documentation

## 2022-05-19 DIAGNOSIS — K635 Polyp of colon: Secondary | ICD-10-CM | POA: Diagnosis not present

## 2022-05-19 DIAGNOSIS — J45909 Unspecified asthma, uncomplicated: Secondary | ICD-10-CM | POA: Diagnosis not present

## 2022-05-19 DIAGNOSIS — K5730 Diverticulosis of large intestine without perforation or abscess without bleeding: Secondary | ICD-10-CM | POA: Insufficient documentation

## 2022-05-19 DIAGNOSIS — K644 Residual hemorrhoidal skin tags: Secondary | ICD-10-CM | POA: Diagnosis not present

## 2022-05-19 DIAGNOSIS — Z9049 Acquired absence of other specified parts of digestive tract: Secondary | ICD-10-CM | POA: Diagnosis not present

## 2022-05-19 DIAGNOSIS — Z87891 Personal history of nicotine dependence: Secondary | ICD-10-CM | POA: Diagnosis not present

## 2022-05-19 DIAGNOSIS — Z6841 Body Mass Index (BMI) 40.0 and over, adult: Secondary | ICD-10-CM | POA: Insufficient documentation

## 2022-05-19 DIAGNOSIS — D123 Benign neoplasm of transverse colon: Secondary | ICD-10-CM | POA: Diagnosis not present

## 2022-05-19 DIAGNOSIS — I10 Essential (primary) hypertension: Secondary | ICD-10-CM | POA: Diagnosis not present

## 2022-05-19 HISTORY — PX: POLYPECTOMY: SHX5525

## 2022-05-19 HISTORY — PX: COLONOSCOPY WITH PROPOFOL: SHX5780

## 2022-05-19 HISTORY — PX: BIOPSY: SHX5522

## 2022-05-19 SURGERY — COLONOSCOPY WITH PROPOFOL
Anesthesia: Monitor Anesthesia Care

## 2022-05-19 MED ORDER — EPHEDRINE SULFATE-NACL 50-0.9 MG/10ML-% IV SOSY
PREFILLED_SYRINGE | INTRAVENOUS | Status: DC | PRN
  Administered 2022-05-19: 5 mg via INTRAVENOUS

## 2022-05-19 MED ORDER — PROPOFOL 10 MG/ML IV BOLUS
INTRAVENOUS | Status: DC | PRN
  Administered 2022-05-19: 50 mg via INTRAVENOUS
  Administered 2022-05-19: 10 mg via INTRAVENOUS
  Administered 2022-05-19: 20 mg via INTRAVENOUS

## 2022-05-19 MED ORDER — LIDOCAINE 2% (20 MG/ML) 5 ML SYRINGE
INTRAMUSCULAR | Status: DC | PRN
  Administered 2022-05-19 (×2): 50 mg via INTRAVENOUS

## 2022-05-19 MED ORDER — PROPOFOL 1000 MG/100ML IV EMUL
INTRAVENOUS | Status: AC
  Filled 2022-05-19: qty 100

## 2022-05-19 MED ORDER — PROPOFOL 500 MG/50ML IV EMUL
INTRAVENOUS | Status: DC | PRN
  Administered 2022-05-19: 125 ug/kg/min via INTRAVENOUS

## 2022-05-19 MED ORDER — LACTATED RINGERS IV SOLN: INTRAVENOUS | Status: DC

## 2022-05-19 MED ORDER — ONDANSETRON HCL 4 MG/2ML IJ SOLN
INTRAMUSCULAR | Status: DC | PRN
  Administered 2022-05-19: 4 mg via INTRAVENOUS

## 2022-05-19 SURGICAL SUPPLY — 22 items

## 2022-05-19 NOTE — Op Note (Signed)
[facility] Patient Name: Nichole Frank Procedure Date: 05/19/2022 MRN: [id number] Attending MD: Ronnette Juniper , MD, [id number] Date of Birth: Nov 21, 1950 CSN: [id number] Age: 71 Admit Type: Outpatient Procedure:                Colonoscopy Indications:              High risk colon cancer surveillance: Personal                            history of non-advanced adenoma, Last colonoscopy:                            2018, Incidental - Diarrhea Providers:                Ronnette Juniper, MD, Dulcy Fanny, Janee Morn,                            Technician Referring MD:             Rosanne Ashing Medicines:                Monitored Anesthesia Care Complications:            No immediate complications. Estimated blood loss:                            Minimal. Estimated Blood Loss:     Estimated blood loss was minimal. Procedure:                Pre-Anesthesia Assessment:                           - Prior to the procedure, a History and Physical                            was performed, and patient medications and                            allergies were reviewed. The patient's tolerance of                            previous anesthesia was also reviewed. The risks                            and benefits of the procedure and the sedation                            options and risks were discussed with the patient.                            All questions were answered, and informed consent                            was obtained. Prior Anticoagulants: The patient has                            taken no anticoagulant or antiplatelet  agents. ASA                            Grade Assessment: III - A patient with severe                            systemic disease. After reviewing the risks and                            benefits, the patient was deemed in satisfactory                            condition to undergo the procedure.                           After obtaining  informed consent, the colonoscope                            was passed under direct vision. Throughout the                            procedure, the patient's blood pressure, pulse, and                            oxygen saturations were monitored continuously. The                            [id number]-HQ190L ([id number]) Olympus colonoscope was                            introduced through the anus and advanced to the the                            terminal ileum. The colonoscopy was performed                            without difficulty. The patient tolerated the                            procedure well. The quality of the bowel                            preparation was fair in right colon and adequate to                            identify >20m polyps in transverse and left colon.                            The terminal ileum, ileocecal valve, appendiceal                            orifice, and rectum were photographed. Scope In: 9:51:06 AM Scope Out: 10:07:50 AM Scope Withdrawal Time: 0 hours 11 minutes 47 seconds  Total Procedure Duration: 0 hours 16 minutes  44 seconds  Findings:      The terminal ileum appeared normal.      A 7 mm polyp was found in the sigmoid colon. The polyp was sessile. The       polyp was removed with a hot snare. Resection and retrieval were       complete.      Two sessile polyps were found in the transverse colon. The polyps were 7       to 8 mm in size. These polyps were removed with a hot snare. Resection       and retrieval were complete.      Biopsies for histology were taken with a cold forceps for evaluation of       microscopic colitis.      Multiple large-mouthed, medium-mouthed and small-mouthed diverticula       were found in the sigmoid colon, descending colon and transverse colon.      Non-bleeding internal hemorrhoids were found during retroflexion.      Hemorrhoids were found on perianal exam. Impression:               - Preparation of the colon  was fair.                           - The examined portion of the ileum was normal.                           - One 7 mm polyp in the sigmoid colon, removed with                            a hot snare. Resected and retrieved.                           - Two 7 to 8 mm polyps in the transverse colon,                            removed with a hot snare. Resected and retrieved.                           - Diverticulosis in the sigmoid colon, in the                            descending colon and in the transverse colon.                           - Non-bleeding internal hemorrhoids.                           - Hemorrhoids found on perianal exam.                           - Biopsies were taken with a cold forceps for                            evaluation of microscopic colitis. Moderate Sedation:      Patient did not receive moderate sedation for this procedure, but  instead received monitored anesthesia care. Recommendation:           - Patient has a contact number available for                            emergencies. The signs and symptoms of potential                            delayed complications were discussed with the                            patient. Return to normal activities tomorrow.                            Written discharge instructions were provided to the                            patient.                           - High fiber diet.                           - Continue present medications.                           - Await pathology results.                           - Repeat colonoscopy for surveillance based on                            pathology results. Procedure Code(s):        --- Professional ---                           [id number], Colonoscopy, flexible; with removal of                            tumor(s), polyp(s), or other lesion(s) by snare                            technique                           45380, 10, Colonoscopy, flexible; with biopsy,                             single or multiple Diagnosis Code(s):        --- Professional ---                           Z86.010, Personal history of colonic polyps                           K64.8, Other hemorrhoids  D12.5, Benign neoplasm of sigmoid colon                           D12.3, Benign neoplasm of transverse colon (hepatic                            flexure or splenic flexure)                           K64.4, Residual hemorrhoidal skin tags                           K57.30, Diverticulosis of large intestine without                            perforation or abscess without bleeding CPT copyright 2022 American Medical Association. All rights reserved. The codes documented in this report are preliminary and upon coder review may  be revised to meet current compliance requirements. Ronnette Juniper, MD 05/19/2022 10:16:46 AM This report has been signed electronically. Number of Addenda: 0

## 2022-05-19 NOTE — Anesthesia Postprocedure Evaluation (Signed)
Anesthesia Post Note  Patient: Nichole Frank  Procedure(s) Performed: COLONOSCOPY WITH PROPOFOL POLYPECTOMY BIOPSY     Patient location during evaluation: PACU Anesthesia Type: MAC Level of consciousness: awake and alert Pain management: pain level controlled Vital Signs Assessment: post-procedure vital signs reviewed and stable Respiratory status: spontaneous breathing, nonlabored ventilation, respiratory function stable and patient connected to nasal cannula oxygen Cardiovascular status: stable and blood pressure returned to baseline Postop Assessment: no apparent nausea or vomiting Anesthetic complications: no  No notable events documented.  Last Vitals:  Vitals:   05/19/22 1020 05/19/22 1030  BP: (!) 137/51 (!) 134/51  Pulse: 78 (!) 57  Resp: 19 (!) 22  Temp:    SpO2: 96% 97%    Last Pain:  Vitals:   05/19/22 1030  TempSrc:   PainSc: 0-No pain                 Najah Liverman S

## 2022-05-19 NOTE — Transfer of Care (Signed)
Immediate Anesthesia Transfer of Care Note  Patient: Nichole Frank  Procedure(s) Performed: Procedure(s): COLONOSCOPY WITH PROPOFOL (N/A) POLYPECTOMY BIOPSY  Patient Location: PACU  Anesthesia Type:MAC  Level of Consciousness: Patient easily awoken, sedated, comfortable, cooperative, following commands, responds to stimulation.   Airway & Oxygen Therapy: Patient spontaneously breathing, ventilating well, oxygen via simple oxygen mask.  Post-op Assessment: Report given to PACU RN, vital signs reviewed and stable, moving all extremities.   Post vital signs: Reviewed and stable.  Complications: No apparent anesthesia complications Last Vitals:  Vitals Value Taken Time  BP 129/109 05/19/22 1014  Temp    Pulse 75 05/19/22 1016  Resp 20 05/19/22 1016  SpO2 100 % 05/19/22 1016  Vitals shown include unvalidated device data.  Last Pain:  Vitals:   05/19/22 0933  TempSrc: Temporal  PainSc: 0-No pain         Complications: No notable events documented.

## 2022-05-19 NOTE — Anesthesia Preprocedure Evaluation (Signed)
Anesthesia Evaluation  Patient identified by MRN, date of birth, ID band Patient awake    Reviewed: Allergy & Precautions, H&P , NPO status , Patient's Chart, lab work & pertinent test results  Airway Mallampati: II  TM Distance: >3 FB Neck ROM: Full    Dental no notable dental hx.    Pulmonary asthma , former smoker   Pulmonary exam normal breath sounds clear to auscultation       Cardiovascular hypertension, Normal cardiovascular exam Rhythm:Regular Rate:Normal     Neuro/Psych negative neurological ROS  negative psych ROS   GI/Hepatic negative GI ROS, Neg liver ROS,,,  Endo/Other    Morbid obesity  Renal/GU negative Renal ROS  negative genitourinary   Musculoskeletal negative musculoskeletal ROS (+)    Abdominal   Peds negative pediatric ROS (+)  Hematology negative hematology ROS (+)   Anesthesia Other Findings   Reproductive/Obstetrics negative OB ROS                             Anesthesia Physical Anesthesia Plan  ASA: 3  Anesthesia Plan: MAC   Post-op Pain Management: Minimal or no pain anticipated   Induction: Intravenous  PONV Risk Score and Plan: 2 and Propofol infusion and Treatment may vary due to age or medical condition  Airway Management Planned: Simple Face Mask  Additional Equipment:   Intra-op Plan:   Post-operative Plan:   Informed Consent: I have reviewed the patients History and Physical, chart, labs and discussed the procedure including the risks, benefits and alternatives for the proposed anesthesia with the patient or authorized representative who has indicated his/her understanding and acceptance.     Dental advisory given  Plan Discussed with: CRNA and Surgeon  Anesthesia Plan Comments:        Anesthesia Quick Evaluation

## 2022-05-19 NOTE — Anesthesia Procedure Notes (Signed)
Procedure Name: MAC Date/Time: 05/19/2022 9:46 AM  Performed by: Deliah Boston, CRNAPre-anesthesia Checklist: Patient identified, Emergency Drugs available, Suction available and Patient being monitored Patient Re-evaluated:Patient Re-evaluated prior to induction Oxygen Delivery Method: Simple face mask Preoxygenation: Pre-oxygenation with 100% oxygen Placement Confirmation: positive ETCO2 and breath sounds checked- equal and bilateral

## 2022-05-19 NOTE — Discharge Instructions (Signed)
YOU HAD AN ENDOSCOPIC PROCEDURE TODAY: Refer to the procedure report and other information in the discharge instructions given to you for any specific questions about what was found during the examination. If this information does not answer your questions, please call [facility] office at [phone] to clarify.   YOU SHOULD EXPECT: Some feelings of bloating in the abdomen. Passage of more gas than usual. Walking can help get rid of the air that was put into your GI tract during the procedure and reduce the bloating. If you had a lower endoscopy (such as a colonoscopy or flexible sigmoidoscopy) you may notice spotting of blood in your stool or on the toilet paper. Some abdominal soreness may be present for a day or two, also.  DIET: Your first meal following the procedure should be a light meal and then it is ok to progress to your normal diet. A half-sandwich or bowl of soup is an example of a good first meal. Heavy or fried foods are harder to digest and may make you feel nauseous or bloated. Drink plenty of fluids but you should avoid alcoholic beverages for 24 hours. If you had a esophageal dilation, please see attached instructions for diet.    ACTIVITY: Your care partner should take you home directly after the procedure. You should plan to take it easy, moving slowly for the rest of the day. You can resume normal activity the day after the procedure however YOU SHOULD NOT DRIVE, use power tools, machinery or perform tasks that involve climbing or major physical exertion for 24 hours (because of the sedation medicines used during the test).   SYMPTOMS TO REPORT IMMEDIATELY: A gastroenterologist can be reached at any hour. Please call [phone]  for any of the following symptoms:  Following lower endoscopy (colonoscopy, flexible sigmoidoscopy) Excessive amounts of blood in the stool  Significant tenderness, worsening of abdominal pains  Swelling of the abdomen that is new, acute  Fever of 100  or higher   FOLLOW UP:  If any biopsies were taken you will be contacted by phone or by letter within the next 1-3 weeks. Call [phone]  if you have not heard about the biopsies in 3 weeks.  Please also call with any specific questions about appointments or follow up tests. YOU HAD AN ENDOSCOPIC PROCEDURE TODAY: Refer to the procedure report and other information in the discharge instructions given to you for any specific questions about what was found during the examination. If this information does not answer your questions, please call [facility] office at [phone] to clarify.

## 2022-05-20 LAB — SURGICAL PATHOLOGY

## 2022-05-21 ENCOUNTER — Encounter (HOSPITAL_COMMUNITY): Payer: Self-pay | Admitting: Gastroenterology

## 2023-02-12 ENCOUNTER — Ambulatory Visit: Payer: BC Managed Care – PPO | Admitting: Podiatry

## 2023-02-12 ENCOUNTER — Encounter: Payer: Self-pay | Admitting: Podiatry

## 2023-02-12 DIAGNOSIS — B351 Tinea unguium: Secondary | ICD-10-CM | POA: Diagnosis not present

## 2023-02-12 DIAGNOSIS — L600 Ingrowing nail: Secondary | ICD-10-CM | POA: Diagnosis not present

## 2023-02-12 NOTE — Progress Notes (Signed)
Subjective:   Patient ID: Nichole Frank, female   DOB: 72 y.o.   MRN: [id number]   HPI Patient presents stating that she has had trouble with nail fungus she has had problems with digits and she lost her third nail left and was concerned.  States that she works on these herself.  Patient does not smoke and does have moderate obesity and tries to be active   Review of Systems  All other systems reviewed and are negative.       Objective:  Physical Exam Vitals and nursing note reviewed.  Constitutional:      Appearance: She is well-developed.  Pulmonary:     Effort: Pulmonary effort is normal.  Musculoskeletal:        General: Normal range of motion.  Skin:    General: Skin is warm.  Neurological:     Mental Status: She is alert.     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  I do not see at this point any reason to use oral medicines as she is able to keep them under control

## 2023-08-23 DIAGNOSIS — R262 Difficulty in walking, not elsewhere classified: Secondary | ICD-10-CM | POA: Diagnosis not present

## 2023-08-30 DIAGNOSIS — R262 Difficulty in walking, not elsewhere classified: Secondary | ICD-10-CM | POA: Diagnosis not present

## 2023-09-03 DIAGNOSIS — R262 Difficulty in walking, not elsewhere classified: Secondary | ICD-10-CM | POA: Diagnosis not present

## 2023-09-06 DIAGNOSIS — R262 Difficulty in walking, not elsewhere classified: Secondary | ICD-10-CM | POA: Diagnosis not present

## 2023-09-10 DIAGNOSIS — R262 Difficulty in walking, not elsewhere classified: Secondary | ICD-10-CM | POA: Diagnosis not present

## 2023-09-13 DIAGNOSIS — R262 Difficulty in walking, not elsewhere classified: Secondary | ICD-10-CM | POA: Diagnosis not present

## 2023-09-17 DIAGNOSIS — R262 Difficulty in walking, not elsewhere classified: Secondary | ICD-10-CM | POA: Diagnosis not present

## 2023-10-04 DIAGNOSIS — S7001XA Contusion of right hip, initial encounter: Secondary | ICD-10-CM | POA: Diagnosis not present

## 2023-10-04 DIAGNOSIS — H40013 Open angle with borderline findings, low risk, bilateral: Secondary | ICD-10-CM | POA: Diagnosis not present

## 2023-10-18 DIAGNOSIS — S7001XD Contusion of right hip, subsequent encounter: Secondary | ICD-10-CM | POA: Diagnosis not present

## 2024-02-21 DIAGNOSIS — Z08 Encounter for follow-up examination after completed treatment for malignant neoplasm: Secondary | ICD-10-CM | POA: Diagnosis not present

## 2024-02-21 DIAGNOSIS — L905 Scar conditions and fibrosis of skin: Secondary | ICD-10-CM | POA: Diagnosis not present

## 2024-02-21 DIAGNOSIS — L814 Other melanin hyperpigmentation: Secondary | ICD-10-CM | POA: Diagnosis not present

## 2024-02-21 DIAGNOSIS — Z85820 Personal history of malignant melanoma of skin: Secondary | ICD-10-CM | POA: Diagnosis not present

## 2024-02-21 DIAGNOSIS — L821 Other seborrheic keratosis: Secondary | ICD-10-CM | POA: Diagnosis not present

## 2024-04-14 DIAGNOSIS — I10 Essential (primary) hypertension: Secondary | ICD-10-CM | POA: Diagnosis not present

## 2024-04-14 DIAGNOSIS — M8588 Other specified disorders of bone density and structure, other site: Secondary | ICD-10-CM | POA: Diagnosis not present

## 2024-04-14 DIAGNOSIS — R600 Localized edema: Secondary | ICD-10-CM | POA: Diagnosis not present

## 2024-04-14 DIAGNOSIS — D649 Anemia, unspecified: Secondary | ICD-10-CM | POA: Diagnosis not present

## 2024-04-14 DIAGNOSIS — F419 Anxiety disorder, unspecified: Secondary | ICD-10-CM | POA: Diagnosis not present

## 2024-04-14 DIAGNOSIS — R011 Cardiac murmur, unspecified: Secondary | ICD-10-CM | POA: Diagnosis not present

## 2024-04-14 DIAGNOSIS — R296 Repeated falls: Secondary | ICD-10-CM | POA: Diagnosis not present

## 2024-04-14 DIAGNOSIS — K279 Peptic ulcer, site unspecified, unspecified as acute or chronic, without hemorrhage or perforation: Secondary | ICD-10-CM | POA: Diagnosis not present

## 2024-04-14 DIAGNOSIS — Z9884 Bariatric surgery status: Secondary | ICD-10-CM | POA: Diagnosis not present

## 2024-04-14 DIAGNOSIS — E559 Vitamin D deficiency, unspecified: Secondary | ICD-10-CM | POA: Diagnosis not present

## 2024-04-14 DIAGNOSIS — F330 Major depressive disorder, recurrent, mild: Secondary | ICD-10-CM | POA: Diagnosis not present

## 2024-04-14 DIAGNOSIS — E2839 Other primary ovarian failure: Secondary | ICD-10-CM | POA: Diagnosis not present

## 2024-04-18 DIAGNOSIS — R011 Cardiac murmur, unspecified: Secondary | ICD-10-CM | POA: Diagnosis not present
